# Patient Record
Sex: Female | Born: 1935 | Race: White | Hispanic: No | Marital: Married | Smoking: Never smoker
Health system: Southern US, Community
[De-identification: ages and names within clinical notes are randomized; demographics above are authoritative.]

## PROBLEM LIST (undated history)

## (undated) DIAGNOSIS — M1990 Unspecified osteoarthritis, unspecified site: Secondary | ICD-10-CM

## (undated) DIAGNOSIS — R011 Cardiac murmur, unspecified: Secondary | ICD-10-CM

## (undated) DIAGNOSIS — C801 Malignant (primary) neoplasm, unspecified: Secondary | ICD-10-CM

## (undated) DIAGNOSIS — M25561 Pain in right knee: Secondary | ICD-10-CM

## (undated) DIAGNOSIS — C549 Malignant neoplasm of corpus uteri, unspecified: Secondary | ICD-10-CM

## (undated) DIAGNOSIS — K5090 Crohn's disease, unspecified, without complications: Secondary | ICD-10-CM

## (undated) DIAGNOSIS — C7951 Secondary malignant neoplasm of bone: Secondary | ICD-10-CM

## (undated) DIAGNOSIS — J189 Pneumonia, unspecified organism: Secondary | ICD-10-CM

## (undated) DIAGNOSIS — I10 Essential (primary) hypertension: Secondary | ICD-10-CM

## (undated) HISTORY — DX: Crohn's disease, unspecified, without complications: K50.90

## (undated) HISTORY — DX: Essential (primary) hypertension: I10

## (undated) HISTORY — PX: ABDOMINAL HYSTERECTOMY: SHX81

## (undated) HISTORY — PX: APPENDECTOMY: SHX54

## (undated) HISTORY — PX: NASAL SINUS SURGERY: SHX719

## (undated) HISTORY — PX: COLOSTOMY: SHX63

## (undated) HISTORY — PX: TUBAL LIGATION: SHX77

---
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
## 2000-11-29 HISTORY — PX: SUBTOTAL COLECTOMY: SHX855

## 2000-11-29 HISTORY — PX: ILEOSTOMY: SHX1783

## 2001-01-23 ENCOUNTER — Encounter: Admission: RE | Admit: 2001-01-23 | Discharge: 2001-01-23 | Payer: Self-pay | Admitting: General Surgery

## 2001-02-15 ENCOUNTER — Encounter: Admission: RE | Admit: 2001-02-15 | Discharge: 2001-02-15 | Payer: Self-pay | Admitting: Emergency Medicine

## 2001-02-23 ENCOUNTER — Encounter: Admission: RE | Admit: 2001-02-23 | Discharge: 2001-02-23 | Payer: Self-pay | Admitting: Family Medicine

## 2001-02-28 ENCOUNTER — Ambulatory Visit (HOSPITAL_COMMUNITY): Admission: EM | Admit: 2001-02-28 | Discharge: 2001-02-28 | Payer: Self-pay | Admitting: Family Medicine

## 2002-04-02 ENCOUNTER — Encounter: Payer: Self-pay | Admitting: Family Medicine

## 2002-04-02 ENCOUNTER — Ambulatory Visit (HOSPITAL_COMMUNITY): Admission: RE | Admit: 2002-04-02 | Discharge: 2002-04-02 | Payer: Self-pay | Admitting: Family Medicine

## 2002-04-11 ENCOUNTER — Encounter: Payer: Self-pay | Admitting: Family Medicine

## 2002-04-11 ENCOUNTER — Ambulatory Visit (HOSPITAL_COMMUNITY): Admission: RE | Admit: 2002-04-11 | Discharge: 2002-04-11 | Payer: Self-pay | Admitting: Family Medicine

## 2005-07-28 ENCOUNTER — Ambulatory Visit: Payer: Self-pay | Admitting: Internal Medicine

## 2005-08-05 ENCOUNTER — Ambulatory Visit: Payer: Self-pay | Admitting: Internal Medicine

## 2005-08-05 ENCOUNTER — Ambulatory Visit (HOSPITAL_COMMUNITY): Admission: RE | Admit: 2005-08-05 | Discharge: 2005-08-05 | Payer: Self-pay | Admitting: Internal Medicine

## 2005-08-05 ENCOUNTER — Encounter (INDEPENDENT_AMBULATORY_CARE_PROVIDER_SITE_OTHER): Payer: Self-pay | Admitting: Internal Medicine

## 2006-03-15 ENCOUNTER — Ambulatory Visit (HOSPITAL_COMMUNITY): Admission: RE | Admit: 2006-03-15 | Discharge: 2006-03-15 | Payer: Self-pay | Admitting: Internal Medicine

## 2006-04-06 ENCOUNTER — Ambulatory Visit (HOSPITAL_COMMUNITY): Admission: RE | Admit: 2006-04-06 | Discharge: 2006-04-06 | Payer: Self-pay | Admitting: Internal Medicine

## 2006-09-07 ENCOUNTER — Ambulatory Visit: Payer: Self-pay | Admitting: Internal Medicine

## 2006-11-21 ENCOUNTER — Emergency Department (HOSPITAL_COMMUNITY): Admission: EM | Admit: 2006-11-21 | Discharge: 2006-11-22 | Payer: Self-pay | Admitting: Emergency Medicine

## 2007-03-21 ENCOUNTER — Emergency Department (HOSPITAL_COMMUNITY): Admission: EM | Admit: 2007-03-21 | Discharge: 2007-03-21 | Payer: Self-pay | Admitting: Emergency Medicine

## 2007-03-22 ENCOUNTER — Inpatient Hospital Stay (HOSPITAL_COMMUNITY): Admission: AD | Admit: 2007-03-22 | Discharge: 2007-03-24 | Payer: Self-pay | Admitting: Internal Medicine

## 2007-03-22 ENCOUNTER — Ambulatory Visit: Payer: Self-pay | Admitting: Urgent Care

## 2007-11-30 HISTORY — PX: CATARACT EXTRACTION: SUR2

## 2009-03-12 ENCOUNTER — Ambulatory Visit (HOSPITAL_COMMUNITY): Admission: RE | Admit: 2009-03-12 | Discharge: 2009-03-12 | Payer: Self-pay | Admitting: Internal Medicine

## 2009-03-25 ENCOUNTER — Other Ambulatory Visit: Admission: RE | Admit: 2009-03-25 | Discharge: 2009-03-25 | Payer: Self-pay | Admitting: Obstetrics and Gynecology

## 2009-11-29 HISTORY — PX: COLONOSCOPY: SHX174

## 2010-03-31 ENCOUNTER — Ambulatory Visit (HOSPITAL_COMMUNITY): Admission: RE | Admit: 2010-03-31 | Discharge: 2010-03-31 | Payer: Self-pay | Admitting: Internal Medicine

## 2011-04-16 NOTE — Group Therapy Note (Signed)
Nicole Cochran, Nicole Cochran                    ACCOUNT NO.:  [id number]   MEDICAL RECORD NO.:  [id number]           PATIENT TYPE:  INP   LOCATION:  A310                          FACILITY:  [facility]   PHYSICIAN:  Angus G. Everette Rank, MD   DATE OF BIRTH:  05-Jul-1935   DATE OF PROCEDURE:  DATE OF DISCHARGE:                                 PROGRESS NOTE   This patient has a history of Crohn's disease, a history of subtotal  colectomy with ileostomy.  Was admitted with nausea, vomiting and  diarrhea and felt to have gastroenteritis secondary to food poisoning.  She remains on IV fluids, has had no active vomiting or diarrhea.  Stool  studies are in progress.   OBJECTIVE:  VITAL SIGNS:  Blood pressure 99/42, respirations 20, pulse  68, temp 97.   Chemistries:  Sodium 133, potassium 3.7, chloride 110, CO2 20.   LUNGS:  Clear.  HEART:  Regular rhythm.  ABDOMEN:  Patient has an ileostomy in the right lower quadrant.  Active  bowel sounds.   ASSESSMENT:  Patient was admitted with gastroenteritis.  She does have a  history of Crohn's disease.   She remains on Cipro and Flagyl.  Plan to continue current regimen.      Angus G. Everette Rank, MD  Electronically Signed     AGM/MEDQ  D:  03/23/2007  T:  03/23/2007  Job:  [id number]

## 2011-04-16 NOTE — Consult Note (Signed)
Nicole Cochran, TESCHNER                    ACCOUNT NO.:  [id number]   MEDICAL RECORD NO.:  [id number]          PATIENT TYPE:  INP   LOCATION:  A310                          FACILITY:  [facility]   PHYSICIAN:  R. Garfield Cornea, M.D. DATE OF BIRTH:  [birth year]   DATE OF CONSULTATION:  03/22/2007  DATE OF DISCHARGE:                                 CONSULTATION   REASON FOR CONSULTATION:  Gastroenteritis.   HISTORY OF PRESENT ILLNESS:  The patient is a 76 year old Caucasian  female with history of Crohn's disease, status post subtotal colectomy  with ileostomy in 2002, who was admitted with a 3 day history of nausea,  vomiting, and diarrhea. She states the symptoms occurred approximately 6  hours after she ate hamburger with onions. Her husband and son ate the  same thing. The meal was cooked at home and the meat was thoroughly  cooked. All 3 became abruptly ill about 6 hours after the meal. She  states that initially it was vomiting, followed by diarrhea. She was  seen in the emergency department yesterday and was treated and released.  At that time, her creatinine was 2.29, BUN 36. She saw Dr. Nevada Crane today  and was a direct admit to the hospital for dehydration and  gastroenteritis. The patient states that she is having to empty her  ileostomy bag every 30 minutes. Before she became ill, she would empty  about every 2 to 3 hours. She denies seeing any blood in her bag. She  has had some rectal bleeding. She has a history of chronic rectal  discharge for the last 2 days with some bloody discharge. See below for  details of her last flexible sigmoidoscopy. 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 She denies any hematemesis, heartburn, and abdominal  pain.   HOME MEDICATIONS:  1. Diovan HCT 320/25 mg daily.  2. Amlodipine 10 mg daily.   ALLERGIES:  PENICILLIN AN ADHESIVE TAPE.   PAST MEDICAL HISTORY:  Hypertension, Crohn's disease, status post  subtotal colectomy with end to end ileostomy in  2002 for multiple  colonic perforations. She had colonoscopy in September 2006 by Dr.  Laural Golden for rectal discharge. She was found to have anal stenosis,  proctitis and sigmoiditis. 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 She was treated with __________  suppositories with some improvement and has not been seen in over a  year. She had sinus surgery twice, appendectomy, tubal ligation.   FAMILY HISTORY:  She has a niece who has Crohn's disease. A sister was  diagnosed with colon cancer at age 32.   SOCIAL HISTORY:  She is married with 5 children. 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 She is a non-smoker, no alcohol  use.   REVIEW OF SYSTEMS:  GASTROINTESTINAL:  See HPI. CONSTITUTIONAL:  Denies  weight loss other than the last couple of days. She feels weak.  CARDIOPULMONARY:  No chest pain or shortness of breath. GENITOURINARY:  She states that she has not urinated since yesterday.   PHYSICAL EXAMINATION:  VITAL SIGNS:  Temperature 96.8, pulse 86,  respiratory rate 20, blood pressure 113/72. Weight 150.9 pounds.  GENERAL:  A pleasant elderly Caucasian female in no acute distress.  SKIN:  Warm and dry. No jaundice.  HEENT:  Sclerae nonicteric. Oropharyngeal mucosa moist and pink.  CHEST:  Lungs clear to auscultation.  CARDIOVASCULAR:  Regular rate and rhythm. No murmur, rub, or gallop.  ABDOMEN:  Positive bowel sounds. Soft and nondistended. Multiple scars  from previous incisions. She has an ileostomy in the right lower  quadrant that has greenish brown liquid stool and air present. Non-  tender. No organomegaly or masses.  EXTREMITIES:  No edema.   LABORATORY DATA:  White count 8,200. Hemoglobin 13.9. Platelets 362,000.  Sodium 134, potassium 3.9, BUN 46, creatinine 2.13. Glucose 122, total  bilirubin 0.6. Alkaline phosphatase 64. AST 30, ALT 27, albumin 4.2.   IMPRESSION:  The patient is a 76 year old  Caucasian female with history  of Crohn's disease, status post subtotal colectomy with ileostomy, who  now presents with acute onset nausea, vomiting, and diarrhea. She most  likely has gastroenteritis secondary to food poisoning. She has symptoms  that occurred abruptly after a meal with 2 other family members, who  became ill as well. Chronically, she has had no symptoms suggestive of  active Crohn's disease.   RECOMMENDATIONS:  1. Agree with stool studies. Will also include culture, O&P, WBC, and      E. coli 0157:H7.  2. Supportive measures.  3. Will address with Dr. Gala Romney within the setting of known history of      Crohn's disease. Will consider empiric trial of antibiotics with      Ciprofloxacin and Flagyl.  4. Further recommendations to follow.      Neil Crouch, P.ABridgette Habermann, M.D.  Electronically Signed    LL/MEDQ  D:  03/22/2007  T:  03/22/2007  Job:  [id number]   cc:   Delphina Cahill, M.D.  Fax: [phone]

## 2011-04-16 NOTE — Op Note (Signed)
Nicole Cochran, Nicole Cochran                    ACCOUNT NO.:  [id number]   MEDICAL RECORD NO.:  [id number]          PATIENT TYPE:  AMB   LOCATION:  DAY                           FACILITY:  [facility]   PHYSICIAN:  Hildred Laser, M.D.    DATE OF BIRTH:  1935/08/15   DATE OF PROCEDURE:  08/05/2005  DATE OF DISCHARGE:                                 OPERATIVE REPORT   PROCEDURE:  Flexible sigmoidoscopy.   INDICATION:  Nicole Cochran is 76 year old Caucasian female with intermittent rectal  discharge which at times is blood-tinged. She has history of Crohn's  disease. She has subtotal colectomy with end ileostomy in April 2002 for  colonic perforation secondary to Crohn's disease. It is suspected that she  may have diversion colitis, but we need to make sure that she does not have  neoplasm in the rectum or sigmoid stump. Procedure risks were reviewed the  patient, informed consent was obtained.   PREMEDICATION:  None.   FINDINGS:  Procedure performed in endoscopy suite. The patient was placed  left lateral position. Rectal examination performed. She had no external  abnormality, but on digital exam she had anal stenosis. There was no mass.  Stenosis felt to be noncritical. Olympus videoscope was placed rectum where  mucosa was noted to be friable with multiple erosions. There was diffuse  involvement. Scope was passed into the distal sigmoid colon leading to the  blunt-end and this segment of mucosa was also involved diffusely. Pictures  taken followed by biopsies from the rectum. Was unable to retroflex the  scope in the rectum. Endoscope was withdrawn.   Acute proctitis and sigmoiditis of distal sigmoid rim. These changes are  felt to be very suspicious for diversion colitis.   RECOMMENDATIONS:  Conasa suppositories 1 gram per rectum at bedtime daily  for 30 days.   I will be contacting the patient with biopsy results.  If this therapy does  not work next step would be short chain fatty acid  enemas.      Hildred Laser, M.D.  Electronically Signed     NR/MEDQ  D:  08/05/2005  T:  08/05/2005  Job:  [id number]   cc:   Angus G. Everette Rank, [place]  [place] [address]  Fax: [phone]

## 2011-04-16 NOTE — Discharge Summary (Signed)
Nicole Cochran, Nicole Cochran                    ACCOUNT NO.:  [id number]   MEDICAL RECORD NO.:  [id number]          PATIENT TYPE:  INP   LOCATION:  A310                          FACILITY:  [facility]   PHYSICIAN:  Delphina Cahill, M.D.        DATE OF BIRTH:  1935/08/02   DATE OF ADMISSION:  03/22/2007  DATE OF DISCHARGE:  04/25/2008LH                               DISCHARGE SUMMARY   DISCHARGE DIAGNOSES:  1. Severe diarrhea.  2. Dehydration.  3. Gastroenteritis.  4. History of Crohn disease?  5. Prerenal azotemia.   DISCHARGE MEDICATIONS:  1. Cipro 500 mg b.i.d. for eight more days.  2. Flagyl 500 mg t.i.d. for eight more days.  3. Flora-Q take 1 tablet p.o. daily.  4. The patient is to resume Diovan and Norvasc beginning next Monday.   DISCHARGE PHYSICAL EXAMINATION:  VITAL SIGNS:  Temperature is 97.9,  blood pressure 130/68, pulse 65, respirations 20.  GENERAL:  This is much more healthy-appearing, elderly, white female at  this time.  No acute distress.  SKIN:  Warm and dry.  HEENT:  Unremarkable.  CHEST: Clear to auscultation bilaterally.  HEART:  Regular rate and rhythm.  No murmurs, gallops or rubs.  ABDOMEN: Soft, nontender, positive bowel sounds.  Ileostomy is in the  right abdomen.  It shows normal type stool.  EXTREMITIES:  No lower extremity edema.  NEUROLOGIC:  Cranial nerves II-XII intact.  No deficits.   RESULTS OBTAINED DURING HOSPITALIZATION:  Initial CBC showed white count  8.2, hemoglobin 13.9.  The day before discharge, CBC shows white count  of 5.9, hemoglobin 11.5, platelet count 283.  Initial B-MET on admission  showed a BUN of 46, creatinine of 2.13.  Normal liver enzymes.  UA  showed large leukocytes, large blood, but otherwise normal.  C diff  toxin was negative.  Ova and parasites was negative.  Fecal lactoferrin  is negative.  Stool culture showed no suspicious colonies.  E-coli toxin  still pending.   HOSPITAL COURSE:  The patient had a question of gastroenteritis  versus  some form of food-borne illness, was seen and assessed by Dr. Gala Romney, and  given the history of Crohn disease, although I am not sure exactly of  this diagnosis as the patient denies that she has Crohn disease,  although it was mentioned throughout her previous chart.  She was  started on empiric antibiotics, both Cipro and Flagyl I.V. and tolerated  these without problems.  She received significant amount of fluid and  had decreased amount of diarrhea.   Prerenal azotemia.  Initially when she came in and had an elevated BUN  and creatinine.  At the time of the day before discharge, her BUN had  returned back to 20 with creatinine a 1.06, still up a little bit from  her baseline.  We will recheck a B-MET in 2 weeks to assure that has  returned back normal.  She is to hold her blood pressure medicines for  next several days, until she is able to eat and drink at her fullest.  Orthostatic hypotension on exam.  Initially, she did have orthostasis  and did that have weakness more with standing.  This has resolved with  IV fluids and is doing much better now.   DISPOSITION:  The patient will be discharged to home and follow up in  the office in 2 weeks for further assessment.  She is call back if she has any further problems before then.  She is return to work on March 27, 2007, unless she has any other  concerns.      Delphina Cahill, M.D.  Electronically Signed     ZH/MEDQ  D:  03/24/2007  T:  03/24/2007  Job:  [id number]

## 2011-04-16 NOTE — H&P (Signed)
NAMESINCLAIRE, ARTIGA                    ACCOUNT NO.:  [id number]   MEDICAL RECORD NO.:  [id number]           PATIENT TYPE:  INP   LOCATION:  A310                          FACILITY:  [facility]   PHYSICIAN:  Delphina Cahill, M.D.        DATE OF BIRTH:  [birth year]   DATE OF ADMISSION:  DATE OF DISCHARGE:  LH                              HISTORY & PHYSICAL   CHIEF COMPLAINT:  Severe weakness and continued diarrhea.   HISTORY OF PRESENT ILLNESS:  Ms. Stallone is an 76 year old white female who  has extensive abdominal history, status post a subtotal colectomy with  end ileostomy in April 2002 with what was believed to be multiple  colonic perforations secondary to Crohn disease, but unclear whether  this was true; and whether it was related more to diversional colitis  which is what is to be more likely the case.  She has seen Dr. Laural Golden  before for this.  She was in her usual state of health up until 4 days  ago, when she states she ate a hamburger with onion and began having  nausea and one episode of vomiting which then turned into profuse  diarrhea, and the diarrhea continued until she went to the emergency  department yesterday.  At that time lab work was obtained revealing  dehydration and probably some prerenal azotemia with elevated creatinine  and BUN.  She was given significant amount of fluids and given something  for the diarrhea.  She did take two doses of Imodium last evening, but  continues to have worsening diarrhea.  The worst complaint is that she  feels significantly weak.  She did not have any problems with abdominal  pain, at this time; and has not had any specific abdominal pain.  She  does not notice any blood in her colostomy, and no black tarry stools  noted.   On exam, today, she does have orthostatic-type hypotension and continues  to be severely weak.   PAST MEDICAL HISTORY:  Significant for chronic diarrhea previously;  question history of Crohn disease versus diversional  colitis,  hypertension, status post tubal ligation, appendectomy. She has had  sinus surgery before; has had D&C before, and anxiety before.   FAMILY HISTORY:  The patient denies any specific cancers in her family.  Her father died of MI and pneumonia.  Mother died at age 49, unknown  cause.  She has seven sisters; two died of kidney failure, and two  brothers one who died of MI and diabetes, some type of cancer unknown.   SOCIAL HISTORY:  She has four children.  She retired from work and has  currently been working as a Tourist information centre manager at [employer].  She denies any smoking  or alcohol use.   REVIEW OF SYSTEMS:  Denies any chest pain.  No shortness of breath.  No  fever or chills.  Some abdominal discomfort with the diarrhea, but no  significant pain.  No problems with urination.   PHYSICAL EXAM:  VITAL SIGNS:  Today, reveals a blood pressure initially  sitting of  110/68 with a pulse of 82; when patient stands blood pressure  is 95/62 with a pulse in the 95 range.  GENERAL:  This a pleasant well-developed, pale-appearing white female in  no acute distress.  SKIN:  Warm and dry.  No signs of jaundice.  HEENT: Pupils equal, round, and react to light and accommodation.  Oropharynx is clear, mildly dry mucosa.  CHEST:  Clear to auscultation bilaterally.  HEART:  Regular rate and rhythm.  No murmurs, gallops, or rubs.  ABDOMEN:  Soft, nontender, positive bowel sounds.  No hepatosplenomegaly  appreciated.  No guarding.  Ileostomy in right abdomen with a tan stool  present.  EXTREMITIES:  No lower extremity edema.  NEUROLOGIC:  Cranial nerves II-XII intact.  No deficits.   IMPRESSION:  Ms. Gunnels is a 76 year old with what appears to be a  gastroenteritis, but also found to have some dehydration and some likely  prerenal azotemia.   ASSESSMENT AND PLAN:  1. Gastroenteritis.  She has no more signs of nausea and vomiting, but      continues to have diarrhea.  Will give one dose of Lomotil and do       that b.i.d. as needed.  We will get gastroenterology to see the      patient, given her significant history before.  Unclear whether      this is related to any of her colitis or question of Crohn's      disease before.  2. Prerenal azotemia acute renal failure.  On check yesterday in the      emergency department did have a creatinine of 2.29 and a BUN of 39      which is up from her normal baseline, which was last recorded to be      a creatinine of 0.73 with a BUN of 8.  Will rehydrate with the same      amount of fluids and check a spot urine, creatinine and sodium; and      see if it truly is prerenal azotemia.  3. Orthostatic hypotension secondary to dehydration; and she continues      to have a generalized weakness.  We will replete with fluids and      recheck in the morning.  4. Hypertension.  The patient has been off of her blood pressure      medicines since Sunday, when this episode started.  Will hold off      on all of blood pressure medicines for now, given her orthostatic      hypotension, as well as her acute renal failure.  If this is not      resolved; then we will get a kidney specialist to see the patient.   DISPOSITION:  The patient admitted to the hospital and routine lab work  will be checked.      Delphina Cahill, M.D.  Electronically Signed     ZH/MEDQ  D:  03/22/2007  T:  03/22/2007  Job:  [id number]

## 2011-04-16 NOTE — H&P (Signed)
Nicole Cochran, Nicole Cochran                    ACCOUNT NO.:  [id number]   MEDICAL RECORD NO.:  [id number]           PATIENT TYPE:  AMB   LOCATION:                                FACILITY:  [facility]   PHYSICIAN:  Hildred Laser, M.D.    DATE OF BIRTH:  August 06, 1935   DATE OF ADMISSION:  DATE OF DISCHARGE:  LH                                HISTORY & PHYSICAL   CHIEF COMPLAINT:  Yearly followup, Crohn's disease.   HISTORY OF PRESENT ILLNESS:  Nicole Cochran is a 76 year old Caucasian female who  is status post subtotal colectomy with end-ileostomy in April of 2002 for  multiple colonic perforations secondary to Crohn's disease. Her surgery was  done at [facility]. She was last seen on  July 06, 2004. She was having episodes of rectal discharge and felt to  possibly have diversion colitis. She was being treated with topical  mesalamine suppositories. She had good response to treatment. She was  advised if she had recurrent rectal discharge that proctoscopy would be  recommended because she is still at risk for rectal or sigmoid colon  neoplasm. Overall, she feels well. She has good energy level. No nausea,  vomiting, anorexia. Her ileostomy output has been normal. Denies any melena  or rectal bleeding. She has not been on her blood pressure medications  lately. She states she checks her blood pressure three times a week at [facility]. It typically runs in the 140s/80s. She has had some issues with  anxiety and is taking Ativan on a p.r.n. basis.   CURRENT MEDICATIONS:  1.  Multivitamin daily.  2.  Ativan 0.5 mg 1/4 of a tablet p.r.n.   ALLERGIES:  PENICILLIN.   PAST MEDICAL HISTORY:  1.  History of chronic diarrhea which she treated for years with Kaopectate      and Imodium. She had a flexible sigmoidoscopy by Dr. Laural Golden in 1999      which was normal to 55 cm. She has had rectal fissure with surgery at      [facility] on February 10, 2005. Ultimately ended up with subtotal  colectomy with      end-ileostomy for multiple colonic perforations on April 2002 at [facility].  2.  Crohn's disease as above.  3.  Hypertension.  4.  Status post tubal ligation and appendectomy.  5.  Heart catheterization 1994.  6.  Sinus surgery twice.  7.  D&C.   FAMILY HISTORY:  Negative for colorectal cancer. She has a niece with  Crohn's disease.   SOCIAL HISTORY:  She is married and has five children. 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 Denies any smoking or alcohol use.   REVIEW OF SYSTEMS:  CARDIOPULMONARY:  Denies chest pain, shortness of  breath, or cough. CONSTITUTIONAL:  Denies weight loss, fatigue, anorexia.   PHYSICAL EXAMINATION:  VITAL SIGNS:  Weight  154, height 5 foot 2,  temperature 98, blood pressure 172/80, pulse 72.  GENERAL:  Pleasant, well-developed, well-nourished, Caucasian female in no  acute distress.  SKIN:  Warm and dry. No jaundice.  HEENT:  Conjunctivae are pink. Sclerae are nonicteric. Oropharyngeal mucosa  moist and pink. No lesions, erythema, or exudate. No lymphadenopathy or  thyromegaly.  CHEST:  Lungs are clear to auscultation.  CARDIAC:  Reveals regular rate and rhythm. Normal S1 and S2. No murmurs,  rubs, or gallops.  ABDOMEN:  Positive bowel sounds, soft, nontender, nondistended. No  organomegaly or masses. No rebound tenderness or guarding. Ileostomy in the  right mid abdomen with pasty green stool present.  EXTREMITIES:  No edema.  RECTAL:  Deferred until the time of proctoscopy.   IMPRESSION:  Nicole Cochran is a 76 year old lady with subtotal colectomy and end-  ileostomy in April of 2002 for colonic perforation related to Crohn's  disease. She has had rectal discharge off and on for over a year now.  Previously, this responded well to mesalamine topical therapy. She is still  at risk for rectal or  sigmoid colon neoplasm. I suspect we are dealing with  Crohn's colitis.   PLAN:  1.  Proctoscopy in the near future.  2.  She asked me to refill her Kenalog aerosol spray 63 g container apply as      directed with 1 refill. She uses this when she breaks down around her      ileostomy site.      Neil Crouch, P.A.      Hildred Laser, M.D.  Electronically Signed    LL/MEDQ  D:  07/28/2005  T:  07/28/2005  Job:  [id number]   cc:   Angus G. Everette Rank, [place]  [place] [address]  Fax: [phone]

## 2011-06-29 ENCOUNTER — Other Ambulatory Visit (HOSPITAL_COMMUNITY): Payer: Self-pay | Admitting: Internal Medicine

## 2011-06-29 DIAGNOSIS — Z139 Encounter for screening, unspecified: Secondary | ICD-10-CM

## 2011-07-06 ENCOUNTER — Ambulatory Visit (HOSPITAL_COMMUNITY)
Admission: RE | Admit: 2011-07-06 | Discharge: 2011-07-06 | Disposition: A | Discharge status: Home / Self Care | Payer: Medicare Other | Source: Ambulatory Visit | Attending: Internal Medicine | Admitting: Internal Medicine

## 2011-07-06 DIAGNOSIS — Z1231 Encounter for screening mammogram for malignant neoplasm of breast: Secondary | ICD-10-CM | POA: Insufficient documentation

## 2011-07-06 DIAGNOSIS — Z139 Encounter for screening, unspecified: Secondary | ICD-10-CM

## 2011-11-02 ENCOUNTER — Ambulatory Visit (INDEPENDENT_AMBULATORY_CARE_PROVIDER_SITE_OTHER): Payer: Medicare Other | Admitting: Internal Medicine

## 2011-11-02 ENCOUNTER — Encounter (INDEPENDENT_AMBULATORY_CARE_PROVIDER_SITE_OTHER): Payer: Self-pay | Admitting: Internal Medicine

## 2011-11-02 DIAGNOSIS — I10 Essential (primary) hypertension: Secondary | ICD-10-CM | POA: Insufficient documentation

## 2011-11-02 DIAGNOSIS — E7800 Pure hypercholesterolemia, unspecified: Secondary | ICD-10-CM

## 2011-11-02 DIAGNOSIS — M129 Arthropathy, unspecified: Secondary | ICD-10-CM

## 2011-11-02 DIAGNOSIS — K5090 Crohn's disease, unspecified, without complications: Secondary | ICD-10-CM

## 2011-11-02 DIAGNOSIS — M1990 Unspecified osteoarthritis, unspecified site: Secondary | ICD-10-CM | POA: Insufficient documentation

## 2011-11-02 NOTE — Patient Instructions (Signed)
Continue present medications. OV in 1 year.

## 2011-11-02 NOTE — Progress Notes (Signed)
Subjective:     Patient ID: Nicole Cochran, female   DOB: 1935-01-27, 76 y.o.   MRN: [id number]  HPI Nicole Cochran is a 76 yr old female here for a scheduled visit.  She has a hx of Crohn's disease status post subtotal colectomy with ileostomy in 2002.  Marland Kitchen  She had a flexible sigmoidoscopy back in September 2006 because of intermittent rectal rectal discharge that at times was blood tinged.  She had acute proctitis and sigmoiditis on the distal sigmoid rim. 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 Biopsy revealed lymphoplasmacytic colitis. She was treated with Canasa suppositories for one month.  In December of 2007 she suddenly developed nausea, vomiting and copious diarrhea.  She had a similar episode in April of 2008   She tells me she is doing good . Appetite is good. No weight loss. She is emptying her colostomy bag about every 2 1/2 hrs while at work. No melena or rectal bleeding.   No abdominal pain. She continues to work at [employer]. Last colonoscopy 06/2011: Markedly inflamed colonic tissue showing patchy acute inflammatory infiltrates. Lymphoid follicular hyperplasia, and scattered plasmacytic.  Review of Systems see hpi Current Outpatient Prescriptions  Medication Sig Dispense Refill  . amLODipine-valsartan (EXFORGE) 10-320 MG per tablet Take 1 tablet by mouth daily.        . diclofenac (VOLTAREN) 75 MG EC tablet Take 75 mg by mouth 2 (two) times daily.        . Multiple Vitamin (MULTIVITAMIN) tablet Take 1 tablet by mouth daily.        . Omega-3 Fatty Acids (FISH OIL) 1000 MG CAPS Take 5 capsules by mouth.         Past Surgical History  Procedure Date  . Appendectomy   . Tubal ligation   . Nasal sinus surgery    Past Medical History  Diagnosis Date  . Crohn's    History   Social History  . Marital Status: Married    Spouse Name: N/A    Number of Children: N/A  . Years of Education: N/A   Occupational History  . Not on file.   Social History Main Topics  . Smoking  status: Never Smoker   . Smokeless tobacco: Not on file  . Alcohol Use: No  . Drug Use: No  . Sexually Active: Not on file   Other Topics Concern  . Not on file   Social History Narrative  . No narrative on file   Family Status  Relation Status Death Age  . Mother Deceased     old age  . Father Deceased     MI  . Sister Alive     good health. One deceased diabetes  . Brother Deceased     One MI, One diabetes, lung cancer   Not on File     Objective:   Physical Exam Filed Vitals:   11/02/11 1116  BP: 170/80  Pulse: 72  Temp: 98 F (36.7 C)  Height: 5' 2"  (1.575 m)  Weight: 159 lb (72.122 kg)    Alert and oriented. Skin warm and dry. Oral mucosa is moist. Dentures.. Sclera anicteric, conjunctivae is pink. Thyroid not enlarged. No cervical lymphadenopathy. Lungs clear. Heart regular rate and rhythm.  Abdomen is soft. Bowel sounds are positive. No hepatomegaly. No abdominal masses felt. No tenderness.Ileostomy bag in place  No edema to lower extremities. Patient is alert and oriented.      Assessment:   Crohn's which is  in remission at this time.  She is doing well.     Plan:    OV in one year.

## 2012-01-26 ENCOUNTER — Emergency Department (HOSPITAL_COMMUNITY)
Admission: EM | Admit: 2012-01-26 | Discharge: 2012-01-27 | Disposition: A | Discharge status: Home / Self Care | Payer: Medicare Other | Attending: Emergency Medicine | Admitting: Emergency Medicine

## 2012-01-26 ENCOUNTER — Encounter (HOSPITAL_COMMUNITY): Payer: Self-pay | Admitting: Emergency Medicine

## 2012-01-26 DIAGNOSIS — Z933 Colostomy status: Secondary | ICD-10-CM | POA: Insufficient documentation

## 2012-01-26 DIAGNOSIS — R197 Diarrhea, unspecified: Secondary | ICD-10-CM | POA: Insufficient documentation

## 2012-01-26 DIAGNOSIS — R112 Nausea with vomiting, unspecified: Secondary | ICD-10-CM | POA: Insufficient documentation

## 2012-01-26 DIAGNOSIS — I10 Essential (primary) hypertension: Secondary | ICD-10-CM | POA: Insufficient documentation

## 2012-01-26 DIAGNOSIS — K5090 Crohn's disease, unspecified, without complications: Secondary | ICD-10-CM | POA: Insufficient documentation

## 2012-01-26 NOTE — ED Notes (Signed)
Pt from home with c/o nausea and vomiting that started tonight. Pt has a prescription for Phenergan however has not been able to keep anything down. Pt denies any pain, reports stomach cramping prior to nausea.

## 2012-01-27 ENCOUNTER — Other Ambulatory Visit: Payer: Self-pay

## 2012-01-27 LAB — CBC
HCT: 38.9 % (ref 36.0–46.0)
Hemoglobin: 13.5 g/dL (ref 12.0–15.0)
MCH: 30.7 pg (ref 26.0–34.0)
MCHC: 34.7 g/dL (ref 30.0–36.0)
RDW: 13.4 % (ref 11.5–15.5)

## 2012-01-27 LAB — BASIC METABOLIC PANEL
BUN: 26 mg/dL — ABNORMAL HIGH (ref 6–23)
Calcium: 8.3 mg/dL — ABNORMAL LOW (ref 8.4–10.5)
GFR calc Af Amer: >90 mL/min (ref >90)
GFR calc non Af Amer: 82 mL/min — ABNORMAL LOW (ref >90)
Glucose, Bld: 121 mg/dL — ABNORMAL HIGH (ref 70–99)
Potassium: 3.7 mEq/L (ref 3.5–5.1)
Sodium: 138 mEq/L (ref 135–145)

## 2012-01-27 MED ORDER — SODIUM CHLORIDE 0.9 % IV BOLUS (SEPSIS)
1000.0000 mL | Freq: Once | INTRAVENOUS | Status: AC
  Administered 2012-01-27: 1000 mL via INTRAVENOUS

## 2012-01-27 MED ORDER — ONDANSETRON HCL 4 MG/2ML IJ SOLN
4.0000 mg | Freq: Once | INTRAMUSCULAR | Status: AC
  Administered 2012-01-27: 4 mg via INTRAVENOUS
  Filled 2012-01-27: qty 2

## 2012-01-27 MED ORDER — ONDANSETRON 8 MG PO TBDP: 8.0000 mg | ORAL_TABLET | Freq: Three times a day (TID) | ORAL | Status: AC | PRN

## 2012-01-27 NOTE — ED Notes (Signed)
Patient moved from hallway bed to room #12. No acute distress noted at this time.

## 2012-01-27 NOTE — ED Notes (Addendum)
States nausea is a little better. Denies any needs. Call bell within reach. Will continue to monitor.

## 2012-01-27 NOTE — ED Notes (Signed)
Lab at bedside to draw blood.

## 2012-01-27 NOTE — ED Notes (Signed)
Medicated as ordered for nausea. Denies any needs at this time. Family with patient. Langley Gauss being able to give urine specimen at this time. No distress. No episodes of vomiting.

## 2012-01-27 NOTE — ED Notes (Signed)
Patient states after eating dinner at 1800 tonight she started feeling bad around 1090. Has had four episodes of vomiting since then. States she started having abdominal cramping in upper stomach prior to vomiting. Has an ileostomy in right middle abdomen. States she emptied it prior to arrival. Active bowel sounds in all quads. No abdominal tenderness, soft. In no distress. Awaiting to be seen. Family with patient.

## 2012-01-27 NOTE — ED Notes (Signed)
MD at bedside to evaluate.

## 2012-01-27 NOTE — ED Notes (Signed)
Ambulatory to bathroom to obtain urine specimen via clean catch.

## 2012-01-27 NOTE — ED Notes (Signed)
Patient unable to void for urine specimen at this time. Ambulated back to hallway bed with steady gait.

## 2012-01-27 NOTE — ED Provider Notes (Signed)
History     CSN: [id number]  Arrival date & time 01/26/12  2343   First MD Initiated Contact with Patient 01/27/12 0002      Chief Complaint  Patient presents with  . Nausea  . Emesis    The history is provided by the patient.  patient with nausea and vomiting that started abruptly tonight.  She also reports a significant amount of liquid stool formed in her colostomy bag.  She has a history of Crohn's disease status post colostomy.  She denies abdominal pain at this time.  She denies fevers and chills.  She denies melena and hematochezia.  Her vomitus been nonbloody and nonbilious.  She reports that at times she presented the ER she feels much better at this time.  She had attempted to take Phenergan down home but was unable to keep it down secondary to her severe nausea and vomiting.  She denies chest pain shortness of breath.  She has no fevers or chills.  She feels much better at this time.  Nothing worsens her symptoms.  Nothing improves her symptoms.   Past Medical History  Diagnosis Date  . Crohn's   . Hypertension     Past Surgical History  Procedure Date  . Appendectomy   . Tubal ligation   . Nasal sinus surgery   . Ileostomy     History reviewed. No pertinent family history.  History  Substance Use Topics  . Smoking status: Never Smoker   . Smokeless tobacco: Not on file  . Alcohol Use: No    OB History    Grav Para Term Preterm Abortions TAB SAB Ect Mult Living                  Review of Systems  Gastrointestinal: Positive for vomiting.  All other systems reviewed and are negative.    Allergies  Penicillins and Tape  Home Medications   Current Outpatient Rx  Name Route Sig Dispense Refill  . PROMETHAZINE HCL 25 MG PO TABS Oral Take 25 mg by mouth every 6 (six) hours as needed.    Marland Kitchen AMLODIPINE BESYLATE-VALSARTAN 10-320 MG PO TABS Oral Take 1 tablet by mouth daily.      . CELECOXIB 100 MG PO CAPS Oral Take 100 mg by mouth 2 (two) times daily.      Marland Kitchen DICLOFENAC SODIUM 75 MG PO TBEC Oral Take 75 mg by mouth 2 (two) times daily.      Marland Kitchen ONE-DAILY MULTI VITAMINS PO TABS Oral Take 1 tablet by mouth daily.      Marland Kitchen FISH OIL 1000 MG PO CAPS Oral Take 5 capsules by mouth.      . ONDANSETRON 8 MG PO TBDP Oral Take 1 tablet (8 mg total) by mouth every 8 (eight) hours as needed for nausea. 10 tablet 0    BP 130/58  Pulse 70  Temp(Src) 97.5 F (36.4 C) (Oral)  Resp 16  SpO2 99%  Physical Exam  Nursing note and vitals reviewed. Constitutional: She is oriented to person, place, and time. She appears well-developed and well-nourished. No distress.  HENT:  Head: Normocephalic and atraumatic.  Eyes: EOM are normal.  Neck: Normal range of motion.  Cardiovascular: Normal rate, regular rhythm and normal heart sounds.   Pulmonary/Chest: Effort normal and breath sounds normal.  Abdominal: Soft. She exhibits no distension. There is no tenderness. There is no rebound and no guarding.       air a small amount of liquid stool  that is normal in color in the colostomy bag  Musculoskeletal: Normal range of motion.  Neurological: She is alert and oriented to person, place, and time.  Skin: Skin is warm and dry.  Psychiatric: She has a normal mood and affect. Judgment normal.    ED Course  Procedures (including critical care time)   Date: 01/27/2012  Rate: 73  Rhythm: normal sinus rhythm  QRS Axis: normal  Intervals: normal  ST/T Wave abnormalities: normal  Conduction Disutrbances: none  Narrative Interpretation:   Old EKG Reviewed: no EKG available     Labs Reviewed  CBC - Abnormal; Notable for the following:    WBC 18.8 (*)    All other components within normal limits  BASIC METABOLIC PANEL - Abnormal; Notable for the following:    Glucose, Bld 121 (*)    BUN 26 (*)    Calcium 8.3 (*)    GFR calc non Af Amer 82 (*)    All other components within normal limits  URINALYSIS, WITH MICROSCOPIC   No results found.   1. Nausea vomiting  and diarrhea       MDM  The patient is well-appearing.  Her abdomen is benign.  Her colostomy has liquid stool and gas.  Is no clinical signs of a small bowel obstruction.  This is likely isolated nausea vomiting and diarrhea.  She feels much better at this time.  She is tolerating oral fluids.  DC home in good condition.        Hoy Morn, MD 01/27/12 [phone]

## 2012-06-16 ENCOUNTER — Other Ambulatory Visit (HOSPITAL_COMMUNITY): Payer: Self-pay | Admitting: Internal Medicine

## 2012-06-16 DIAGNOSIS — Z139 Encounter for screening, unspecified: Secondary | ICD-10-CM

## 2012-07-11 ENCOUNTER — Ambulatory Visit (HOSPITAL_COMMUNITY): Payer: Medicare Other

## 2012-07-12 ENCOUNTER — Ambulatory Visit (HOSPITAL_COMMUNITY)
Admission: RE | Admit: 2012-07-12 | Discharge: 2012-07-12 | Disposition: A | Discharge status: Home / Self Care | Payer: Medicare Other | Source: Ambulatory Visit | Attending: Internal Medicine | Admitting: Internal Medicine

## 2012-07-12 DIAGNOSIS — Z139 Encounter for screening, unspecified: Secondary | ICD-10-CM

## 2012-07-12 DIAGNOSIS — Z1231 Encounter for screening mammogram for malignant neoplasm of breast: Secondary | ICD-10-CM | POA: Insufficient documentation

## 2012-10-12 ENCOUNTER — Encounter (HOSPITAL_COMMUNITY): Payer: Self-pay | Admitting: *Deleted

## 2012-10-12 ENCOUNTER — Emergency Department (HOSPITAL_COMMUNITY)
Admission: EM | Admit: 2012-10-12 | Discharge: 2012-10-13 | Disposition: A | Discharge status: Home / Self Care | Payer: Medicare Other | Attending: Emergency Medicine | Admitting: Emergency Medicine

## 2012-10-12 ENCOUNTER — Emergency Department (HOSPITAL_COMMUNITY): Payer: Medicare Other

## 2012-10-12 DIAGNOSIS — Y92009 Unspecified place in unspecified non-institutional (private) residence as the place of occurrence of the external cause: Secondary | ICD-10-CM | POA: Insufficient documentation

## 2012-10-12 DIAGNOSIS — Z791 Long term (current) use of non-steroidal anti-inflammatories (NSAID): Secondary | ICD-10-CM | POA: Insufficient documentation

## 2012-10-12 DIAGNOSIS — I10 Essential (primary) hypertension: Secondary | ICD-10-CM | POA: Insufficient documentation

## 2012-10-12 DIAGNOSIS — W64XXXA Exposure to other animate mechanical forces, initial encounter: Secondary | ICD-10-CM | POA: Insufficient documentation

## 2012-10-12 DIAGNOSIS — S93609A Unspecified sprain of unspecified foot, initial encounter: Secondary | ICD-10-CM

## 2012-10-12 DIAGNOSIS — Y939 Activity, unspecified: Secondary | ICD-10-CM | POA: Insufficient documentation

## 2012-10-12 DIAGNOSIS — K5090 Crohn's disease, unspecified, without complications: Secondary | ICD-10-CM | POA: Insufficient documentation

## 2012-10-12 NOTE — ED Provider Notes (Signed)
History  This chart was scribed for Sharyon Cable, MD by Era Bumpers, ED scribe. This patient was seen in room APA06/APA06 and the patient's care was started at 1915.   CSN: [id number]  Arrival date & time 10/12/12  9563   First MD Initiated Contact with Patient 10/12/12 2307      Chief Complaint  Patient presents with  . Foot Pain   Patient is a 77 y.o. female presenting with foot injury. The history is provided by the patient. No language interpreter was used.  Foot Injury  The incident occurred 1 to 2 hours ago. The incident occurred at home. The injury mechanism was a direct blow. The pain is present in the left foot. The quality of the pain is described as aching. The pain is moderate. The pain has been constant since onset. Associated symptoms include inability to bear weight. Pertinent negatives include no numbness and no tingling. She reports no foreign bodies present. The symptoms are aggravated by bearing weight. Treatments tried: NSAIDs. The treatment provided mild relief.   Nicole Cochran is a 77 y.o. female who presents to the Emergency Department complaining of left foot injury/pain this PM after her dog at home bumped into her foot. She states has previously injured her left foot before and normally has some pain at baseline but today after injuring it, her left foot became more painful and making it difficult for her to walk. She denies any other injuries and has no previous surgeries on her foot. She states normally has swelling to her bilateral lower legs. She had an X-Finch of her left foot in January which showed degenerative changes of her foot. She denies nausea, emesis, or hx of DVT/PE. She denies numbness/tingling of her foot.   Past Medical History  Diagnosis Date  . Crohn's   . Hypertension     Past Surgical History  Procedure Date  . Appendectomy   . Tubal ligation   . Nasal sinus surgery   . Ileostomy     No family history on file.  History  Substance Use  Topics  . Smoking status: Never Smoker   . Smokeless tobacco: Not on file  . Alcohol Use: No    OB History    Grav Para Term Preterm Abortions TAB SAB Ect Mult Living                  Review of Systems  Constitutional: Negative for fever and chills.  Respiratory: Negative for shortness of breath.   Cardiovascular: Negative for chest pain.  Gastrointestinal: Negative for nausea, vomiting and abdominal pain.  Musculoskeletal:       Left foot pain. Baseline foot swelling.   Neurological: Negative for tingling, weakness and numbness.  All other systems reviewed and are negative.    Allergies  Penicillins and Tape  Home Medications   Current Outpatient Rx  Name  Route  Sig  Dispense  Refill  . ACETAMINOPHEN 500 MG PO TABS   Oral   Take 500 mg by mouth daily as needed.         Marland Kitchen AMLODIPINE BESYLATE 10 MG PO TABS   Oral   Take 10 mg by mouth every evening.          Marland Kitchen ONE-DAILY MULTI VITAMINS PO TABS   Oral   Take 1 tablet by mouth every morning.          Marland Kitchen NAPROXEN SODIUM 220 MG PO TABS   Oral   Take  220 mg by mouth daily as needed. For pain         . FISH OIL 1000 MG PO CAPS   Oral   Take 5 capsules by mouth every morning.          Marland Kitchen UNKNOWN TO PATIENT   Oral   Take 1 tablet by mouth daily. THYROID: name is unknown         . VALSARTAN 320 MG PO TABS   Oral   Take 320 mg by mouth daily.           Triage vitals: BP 140/67  Pulse 76  Temp 97.3 F (36.3 C) (Oral)  Resp 20  Ht 5' 2"  (1.575 m)  Wt 155 lb (70.308 kg)  BMI 28.35 kg/m2  SpO2 99%  Physical Exam CONSTITUTIONAL: Well developed/well nourished HEAD AND FACE: Normocephalic/atraumatic EYES: EOMI/PERRL ENMT: Mucous membranes moist NECK: supple no meningeal signs SPINE:entire spine nontender CV: S1/S2 noted, no murmurs/rubs/gallops noted LUNGS: Lungs are clear to auscultation bilaterally, no apparent distress ABDOMEN: soft, nontender, no rebound or guarding NEURO: Pt is  awake/alert, moves all extremitiesx4 EXTREMITIES: pulses normal, full ROM, Tender to anterior surface of her left foot, no deformity noted Symmetric pitting edema to both legs, no erythema, no calf tenderness SKIN: warm, color normal PSYCH: no abnormalities of mood noted ED Course  Procedures  DIAGNOSTIC STUDIES: Oxygen Saturation is 99% on room air, normal by my interpretation.    COORDINATION OF CARE: At 1125 PM Discussed treatment plan with patient which includes left foot X-Asch. Patient agrees.    Labs Reviewed - No data to display Dg Foot Complete Left  10/12/2012  *RADIOLOGY REPORT*  Clinical Data: Dorsal foot pain.  LEFT FOOT - COMPLETE 3+ VIEW  Comparison: None.  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  No other significant abnormalities of the foot.  Slight arthritis of the ankle joint.  IMPRESSION: Arthritic changes as described.  No acute osseous abnormality.   Original Report Authenticated By: Lorriane Shire, M.D.      1. Foot sprain     Pt reports foot pain for months, worse tonight when dog ran into foot.   Advised postop shoe and f/u with her podiatrist.  Doubt occult fracture.  Doubt PE, reports symmetric edema that is unchanged  MDM  Nursing notes including past medical history and social history reviewed and considered in documentation xrays reviewed and considered   I personally performed the services described in this documentation, which was scribed in my presence. The recorded information has been reviewed and is accurate.            Sharyon Cable, MD 10/13/12 [phone]

## 2012-10-12 NOTE — ED Notes (Signed)
Left foot pain, states she can hardly walk after she sits for a period of time

## 2012-11-07 ENCOUNTER — Encounter (INDEPENDENT_AMBULATORY_CARE_PROVIDER_SITE_OTHER): Payer: Self-pay | Admitting: Internal Medicine

## 2012-11-07 ENCOUNTER — Ambulatory Visit (INDEPENDENT_AMBULATORY_CARE_PROVIDER_SITE_OTHER): Payer: Medicare Other | Admitting: Internal Medicine

## 2012-11-07 VITALS — BP 150/50 | HR 80 | Temp 97.9°F | Ht 62.0 in | Wt 157.9 lb

## 2012-11-07 DIAGNOSIS — K5090 Crohn's disease, unspecified, without complications: Secondary | ICD-10-CM

## 2012-11-07 NOTE — Progress Notes (Signed)
Subjective:     Patient ID: Mare Ferrari, female   DOB: Aug 29, 1935, 77 y.o.   MRN: [id number]  HPI Zaiyah is a 77 yr old female here today for a scheduled visit.  She tells me she is doing good. She stays on the go. She lost her husband in September due to cancer. She continues to work at [employer]. She empties her colostomy bag about every 2 1/2 hrs while she is at work. Stools brown in color. No melena or bright red rectal bleeding. No abdominal pain.   11/02/2011 Office visit: HPI  She has a hx of Crohn's disease status post subtotal colectomy with ileostomy in 2002. Marland Kitchen She had a flexible sigmoidoscopy back in September 2006 because of intermittent rectal rectal discharge that at times was blood tinged. She had acute proctitis and sigmoiditis on the distal sigmoid rim. 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 Biopsy revealed lymphoplasmacytic colitis. Last colonoscopy 06/2011: Markedly inflamed colonic tissue showing patchy acute inflammatory infiltrates. Lymphoid follicular hyperplasia, and scattered plasmacytic.  Review of Systems see hpi Current Outpatient Prescriptions  Medication Sig Dispense Refill  . acetaminophen (TYLENOL) 500 MG tablet Take 500 mg by mouth daily as needed.      Marland Kitchen amLODipine (NORVASC) 10 MG tablet Take 10 mg by mouth every evening.       Marland Kitchen levothyroxine (SYNTHROID, LEVOTHROID) 25 MCG tablet Take 25 mcg by mouth daily.      . Multiple Vitamin (MULTIVITAMIN) tablet Take 1 tablet by mouth every morning.       . naproxen sodium (ANAPROX) 220 MG tablet Take 220 mg by mouth 2 (two) times daily with a meal.      . Omega-3 Fatty Acids (FISH OIL) 1000 MG CAPS Take 5 capsules by mouth every morning.       . valsartan (DIOVAN) 320 MG tablet Take 320 mg by mouth daily.      . naproxen sodium (ALEVE) 220 MG tablet Take 220 mg by mouth daily as needed. For pain      . UNKNOWN TO PATIENT Take 1 tablet by mouth daily. THYROID: name is unknown       Past Medical History   Diagnosis Date  . Crohn's   . Hypertension    Past Surgical History  Procedure Date  . Appendectomy   . Tubal ligation   . Nasal sinus surgery   . Ileostomy    Allergies  Allergen Reactions  . Penicillins     rash  . Tape             Objective:   Physical Exam Filed Vitals:   11/07/12 1031  BP: 150/50  Pulse: 80  Temp: 97.9 F (36.6 C)  Height: 5' 2"  (1.575 m)  Weight: 157 lb 14.4 oz (71.623 kg)   Alert and oriented. Skin warm and dry. Oral mucosa is moist.   . Sclera anicteric, conjunctivae is pink. Thyroid not enlarged. No cervical lymphadenopathy. Lungs clear. Heart regular rate and rhythm.  Abdomen is soft. Bowel sounds are positive. No hepatomegaly. No abdominal masses felt. No tenderness.  No edema to lower extremities.       Assessment:    Crohn's disease which appears to be in remission. She is doing well.    Plan:    OV in 1 yr

## 2012-11-07 NOTE — Patient Instructions (Addendum)
OV in 1 year.  

## 2013-03-27 ENCOUNTER — Other Ambulatory Visit (HOSPITAL_COMMUNITY): Payer: Self-pay | Admitting: Internal Medicine

## 2013-03-27 DIAGNOSIS — M25561 Pain in right knee: Secondary | ICD-10-CM

## 2013-03-28 ENCOUNTER — Ambulatory Visit (HOSPITAL_COMMUNITY)
Admission: RE | Admit: 2013-03-28 | Discharge: 2013-03-28 | Disposition: A | Discharge status: Home / Self Care | Payer: Medicare Other | Source: Ambulatory Visit | Attending: Internal Medicine | Admitting: Internal Medicine

## 2013-03-28 ENCOUNTER — Ambulatory Visit (HOSPITAL_COMMUNITY): Payer: Medicare Other

## 2013-03-28 DIAGNOSIS — M25561 Pain in right knee: Secondary | ICD-10-CM

## 2013-03-28 DIAGNOSIS — S8990XA Unspecified injury of unspecified lower leg, initial encounter: Secondary | ICD-10-CM | POA: Insufficient documentation

## 2013-03-28 DIAGNOSIS — W19XXXA Unspecified fall, initial encounter: Secondary | ICD-10-CM | POA: Insufficient documentation

## 2013-03-28 DIAGNOSIS — M7120 Synovial cyst of popliteal space [Baker], unspecified knee: Secondary | ICD-10-CM | POA: Insufficient documentation

## 2013-03-28 DIAGNOSIS — M25569 Pain in unspecified knee: Secondary | ICD-10-CM | POA: Insufficient documentation

## 2013-04-10 ENCOUNTER — Ambulatory Visit (HOSPITAL_COMMUNITY)
Admission: RE | Admit: 2013-04-10 | Discharge: 2013-04-10 | Disposition: A | Discharge status: Home / Self Care | Payer: Medicare Other | Source: Ambulatory Visit | Attending: Internal Medicine | Admitting: Internal Medicine

## 2013-04-10 ENCOUNTER — Other Ambulatory Visit (HOSPITAL_COMMUNITY): Payer: Self-pay | Admitting: Internal Medicine

## 2013-04-10 DIAGNOSIS — W19XXXA Unspecified fall, initial encounter: Secondary | ICD-10-CM | POA: Insufficient documentation

## 2013-04-10 DIAGNOSIS — M25561 Pain in right knee: Secondary | ICD-10-CM

## 2013-04-10 DIAGNOSIS — S99919A Unspecified injury of unspecified ankle, initial encounter: Secondary | ICD-10-CM | POA: Insufficient documentation

## 2013-04-10 DIAGNOSIS — M25569 Pain in unspecified knee: Secondary | ICD-10-CM | POA: Insufficient documentation

## 2013-04-10 DIAGNOSIS — S8990XA Unspecified injury of unspecified lower leg, initial encounter: Secondary | ICD-10-CM | POA: Insufficient documentation

## 2013-04-17 ENCOUNTER — Ambulatory Visit (INDEPENDENT_AMBULATORY_CARE_PROVIDER_SITE_OTHER): Payer: Medicare Other | Admitting: Orthopedic Surgery

## 2013-04-17 ENCOUNTER — Encounter: Payer: Self-pay | Admitting: Orthopedic Surgery

## 2013-04-17 VITALS — BP 146/62 | Ht 62.0 in | Wt 161.0 lb

## 2013-04-17 DIAGNOSIS — M1710 Unilateral primary osteoarthritis, unspecified knee: Secondary | ICD-10-CM

## 2013-04-17 DIAGNOSIS — M1711 Unilateral primary osteoarthritis, right knee: Secondary | ICD-10-CM | POA: Insufficient documentation

## 2013-04-17 NOTE — Patient Instructions (Addendum)
Wear and Tear Disorders of the Knee (Arthritis, Osteoarthritis) Everyone will experience wear and tear injuries (arthritis, osteoarthritis) of the knee. These are the changes we all get as we age. They come from the joint stress of daily living. The amount of cartilage damage in your knee and your symptoms determine if you need surgery. Mild problems require approximately two months recovery time. More severe problems take several months to recover. With mild problems, your surgeon may find worn and rough cartilage surfaces. With severe changes, your surgeon may find cartilage that has completely worn away and exposed the bone. Loose bodies of bone and cartilage, bone spurs (excess bone growth), and injuries to the menisci (cushions between the large bones of your leg) are also common. All of these problems can cause pain. For a mild wear and tear problem, rough cartilage may simply need to be shaved and smoothed. For more severe problems with areas of exposed bone, your surgeon may use an instrument for roughing up the bone surfaces to stimulate new cartilage growth. Loose bodies are usually removed. Torn menisci may be trimmed or repaired. ABOUT THE ARTHROSCOPIC PROCEDURE Arthroscopy is a surgical technique. It allows your orthopedic surgeon to diagnose and treat your knee injury with accuracy. The surgeon looks into your knee through a small scope. The scope is like a small (pencil-sized) telescope. Arthroscopy is less invasive than open knee surgery. You can expect a more rapid recovery. After the procedure, you will be moved to a recovery area until most of the effects of the medication have worn off. Your caregiver will discuss the test results with you. RECOVERY The severity of the arthritis and the type of procedure performed will determine recovery time. Other important factors include age, physical condition, medical conditions, and the type of rehabilitation program. Strengthening your muscles after  arthroscopy helps guarantee a better recovery. Follow your caregiver's instructions. Use crutches, rest, elevate, ice, and do knee exercises as instructed. Your caregivers will help you and instruct you with exercises and other physical therapy required to regain your mobility, muscle strength, and functioning following surgery. Only take over-the-counter or prescription medicines for pain, discomfort, or fever as directed by your caregiver.  SEEK MEDICAL CARE IF:   There is increased bleeding (more than a small spot) from the wound.  You notice redness, swelling, or increasing pain in the wound.  Pus is coming from wound.  You develop an unexplained oral temperature above 102 F (38.9 C) , or as your caregiver suggests.  You notice a foul smell coming from the wound or dressing.  You have severe pain with motion of the knee. SEEK IMMEDIATE MEDICAL CARE IF:   You develop a rash.  You have difficulty breathing.  You have any allergic problems. MAKE SURE YOU:   Understand these instructions.  Will watch your condition.  Will get help right away if you are not doing well or get worse. Document Released: 11/12/2000 Document Revised: 02/07/2012 Document Reviewed: 04/10/2008 [facility] Patient Information 2013 [place]. Knee Injection Joint injections are shots. Your caregiver will place a needle into your knee joint. The needle is used to put medicine into the joint. These shots can be used to help treat different painful knee conditions such as osteoarthritis, bursitis, local flare-ups of rheumatoid arthritis, and pseudogout. Anti-inflammatory medicines such as corticosteroids and anesthetics are the most common medicines used for joint and soft tissue injections.  PROCEDURE  The skin over the kneecap will be cleaned with an antiseptic solution.  Your caregiver will inject a small amount of a local anesthetic (a medicine like Novocaine) just under the skin in the area that was  cleaned.  After the area becomes numb, a second injection is done. This second injection usually includes an anesthetic and an anti-inflammatory medicine called a steroid or cortisone. The needle is carefully placed in between the kneecap and the knee, and the medicine is injected into the joint space.  After the injection is done, the needle is removed. Your caregiver may place a bandage over the injection site. The whole procedure takes no more than a couple of minutes. BEFORE THE PROCEDURE  Wash all of the skin around the entire knee area. Try to remove any loose, scaling skin. There is no other specific preparation necessary unless advised otherwise by your caregiver. LET YOUR CAREGIVER KNOW ABOUT:   Allergies.  Medications taken including herbs, eye drops, over the counter medications, and creams.  Use of steroids (by mouth or creams).  Possible pregnancy, if applicable.  Previous problems with anesthetics or Novocaine.  History of blood clots (thrombophlebitis).  History of bleeding or blood problems.  Previous surgery.  Other health problems. RISKS AND COMPLICATIONS Side effects from cortisone shots are rare. They include:   Slight bruising of the skin.  Shrinkage of the normal fatty tissue under the skin where the shot was given.  Increase in pain after the shot.  Infection.  Weakening of tendons or tendon rupture.  Allergic reaction to the medicine.  Diabetics may have a temporary increase in their blood sugar after a shot.  Cortisone can temporarily weaken the immune system. While receiving these shots, you should not get certain vaccines. Also, avoid contact with anyone who has chickenpox or measles. Especially if you have never had these diseases or have not been previously immunized. Your immune system may not be strong enough to fight off the infection while the cortisone is in your system. AFTER THE PROCEDURE   You can go home after the procedure.  You  may need to put ice on the joint 15 to 20 minutes every 3 or 4 hours until the pain goes away.  You may need to put an elastic bandage on the joint. HOME CARE INSTRUCTIONS   Only take over-the-counter or prescription medicines for pain, discomfort, or fever as directed by your caregiver.  You should avoid stressing the joint. Unless advised otherwise, avoid activities that put a lot of pressure on a knee joint, such as:  Jogging.  Bicycling.  Recreational climbing.  Hiking.  Laying down and elevating the leg/knee above the level of your heart can help to minimize swelling. SEEK MEDICAL CARE IF:   You have repeated or worsening swelling.  There is drainage from the puncture area.  You develop red streaking that extends above or below the site where the needle was inserted. SEEK IMMEDIATE MEDICAL CARE IF:   You develop a fever.  You have pain that gets worse even though you are taking pain medicine.  The area is red and warm, and you have trouble moving the joint. MAKE SURE YOU:   Understand these instructions.  Will watch your condition.  Will get help right away if you are not doing well or get worse. Document Released: 02/06/2007 Document Revised: 02/07/2012 Document Reviewed: 11/03/2007 [facility] Patient Information 2013 [place].

## 2013-04-17 NOTE — Progress Notes (Signed)
Patient ID: Nicole Cochran, female   DOB: Dec 21, 1934, 78 y.o.   MRN: [id number] Chief Complaint  Patient presents with  . Knee Pain    Right knee pain, Referred by Dr. Merlyn Albert    History 78 year old female still working at [employer] and fell on April 22 was not having any pain prior to that but since then has a dull sharp pain over the medial aspect of her knee which she rates as 6/10 it's better when she's not walking. It seems that walking seems to make her pain worse she reports some locking some swelling no numbness or tingling no bruising. The pain is described as sharp to dull. She actually landed on the right side of the knee but has pain on the medial aspect. She's had an x-Malecha and an MRI her MRI shows that she has medial joint space narrowing and chondral changes consistent with osteoarthritis without fracture and she has some abnormalities in the medial meniscus but no tear there is also tendinosis with thickening of the semimembranosus tendon and a small Baker's cyst  She's here for evaluation and further treatment  Her review of systems is listed by her as normal. She says that she's got have some aches and pains at her age  She's allergic to penicillin and adhesive tape  She has some hypertension and Crohn's disease  Past Surgical History  Procedure Laterality Date  . Appendectomy    . Tubal ligation    . Nasal sinus surgery    . Ileostomy     Medications include amlodipine fish oil vitamins tramadol ibuprofen hydrochlorothiazide combination  She is widowed doesn't smoke or drink  BP 146/62  Ht 5' 2"  (1.575 m)  Wt 161 lb (73.029 kg)  BMI 29.44 kg/m2 General appearance is normal, the patient is alert and oriented x3 with normal mood and affect. She is ambulatory without assistive device doesn't have a significant limp  Her right knee is tender over the medial aspect without joint effusion her range of motion is maintained at 125 knee is stable her motor exam is normal skin is  intact  She has good pulses no edema normal sensation  She has a negative McMurray sign  Impression osteoarthritis with acute exacerbation after a traumatic fall  Recommend cortisone injection maintain normal lifestyle inactive lifestyle  Followup with [place] as needed  Knee  Injection Procedure Note  Pre-operative Diagnosis: right knee oa  Post-operative Diagnosis: same  Indications: pain  Anesthesia: ethyl chloride   Procedure Details   Verbal consent was obtained for the procedure. Time out was completed.The joint was prepped with alcohol, followed by  Ethyl chloride spray and A 20 gauge needle was inserted into the knee via lateral approach; 52m 1% lidocaine and 1 ml of depomedrol  was then injected into the joint . The needle was removed and the area cleansed and dressed.  Complications:  None; patient tolerated the procedure well.

## 2013-06-02 ENCOUNTER — Encounter (HOSPITAL_COMMUNITY): Payer: Self-pay | Admitting: *Deleted

## 2013-06-02 ENCOUNTER — Emergency Department (HOSPITAL_COMMUNITY): Payer: Medicare Other

## 2013-06-02 ENCOUNTER — Inpatient Hospital Stay (HOSPITAL_COMMUNITY)
Admission: EM | Admit: 2013-06-02 | Discharge: 2013-06-09 | DRG: 336 | Disposition: A | Discharge status: Home / Self Care | Payer: Medicare Other | Attending: General Surgery | Admitting: General Surgery

## 2013-06-02 DIAGNOSIS — K56609 Unspecified intestinal obstruction, unspecified as to partial versus complete obstruction: Secondary | ICD-10-CM

## 2013-06-02 DIAGNOSIS — R112 Nausea with vomiting, unspecified: Secondary | ICD-10-CM

## 2013-06-02 DIAGNOSIS — M129 Arthropathy, unspecified: Secondary | ICD-10-CM | POA: Diagnosis present

## 2013-06-02 DIAGNOSIS — K565 Intestinal adhesions [bands], unspecified as to partial versus complete obstruction: Principal | ICD-10-CM | POA: Diagnosis present

## 2013-06-02 DIAGNOSIS — I10 Essential (primary) hypertension: Secondary | ICD-10-CM | POA: Diagnosis present

## 2013-06-02 DIAGNOSIS — IMO0002 Reserved for concepts with insufficient information to code with codable children: Secondary | ICD-10-CM | POA: Diagnosis present

## 2013-06-02 DIAGNOSIS — Z9049 Acquired absence of other specified parts of digestive tract: Secondary | ICD-10-CM

## 2013-06-02 DIAGNOSIS — K435 Parastomal hernia without obstruction or  gangrene: Secondary | ICD-10-CM

## 2013-06-02 HISTORY — DX: Pain in right knee: M25.561

## 2013-06-02 HISTORY — DX: Cardiac murmur, unspecified: R01.1

## 2013-06-02 HISTORY — DX: Unspecified osteoarthritis, unspecified site: M19.90

## 2013-06-02 HISTORY — DX: Pneumonia, unspecified organism: J18.9

## 2013-06-02 LAB — CBC WITH DIFFERENTIAL/PLATELET
Eosinophils Relative: 0 % (ref 0–5)
HCT: 34.4 % — ABNORMAL LOW (ref 36.0–46.0)
Lymphocytes Relative: 7 % — ABNORMAL LOW (ref 12–46)
Lymphs Abs: 0.7 10*3/uL (ref 0.7–4.0)
MCV: 86.9 fL (ref 78.0–100.0)
Monocytes Absolute: 0.2 10*3/uL (ref 0.1–1.0)
Neutro Abs: 8.6 10*3/uL — ABNORMAL HIGH (ref 1.7–7.7)
RBC: 3.96 MIL/uL (ref 3.87–5.11)
WBC: 9.5 10*3/uL (ref 4.0–10.5)

## 2013-06-02 LAB — URINALYSIS W MICROSCOPIC + REFLEX CULTURE
Bilirubin Urine: NEGATIVE
Ketones, ur: NEGATIVE mg/dL
Urobilinogen, UA: 0.2 mg/dL (ref 0.0–1.0)

## 2013-06-02 LAB — COMPREHENSIVE METABOLIC PANEL
ALT: 11 U/L (ref 0–35)
AST: 14 U/L (ref 0–37)
CO2: 26 mEq/L (ref 19–32)
Calcium: 8.5 mg/dL (ref 8.4–10.5)
Chloride: 99 mEq/L (ref 96–112)
GFR calc Af Amer: >90 mL/min (ref >90)
GFR calc non Af Amer: >90 mL/min (ref >90)
Glucose, Bld: 122 mg/dL — ABNORMAL HIGH (ref 70–99)
Sodium: 132 mEq/L — ABNORMAL LOW (ref 135–145)
Total Bilirubin: 0.4 mg/dL (ref 0.3–1.2)

## 2013-06-02 MED ORDER — KCL IN DEXTROSE-NACL 40-5-0.45 MEQ/L-%-% IV SOLN
INTRAVENOUS | Status: DC
  Administered 2013-06-02 – 2013-06-04 (×5): via INTRAVENOUS

## 2013-06-02 MED ORDER — MORPHINE SULFATE 4 MG/ML IJ SOLN
4.0000 mg | INTRAMUSCULAR | Status: AC | PRN
  Administered 2013-06-02 (×2): 4 mg via INTRAVENOUS
  Filled 2013-06-02 (×2): qty 1

## 2013-06-02 MED ORDER — FAMOTIDINE IN NACL 20-0.9 MG/50ML-% IV SOLN
20.0000 mg | Freq: Once | INTRAVENOUS | Status: AC
  Administered 2013-06-02: 20 mg via INTRAVENOUS
  Filled 2013-06-02: qty 50

## 2013-06-02 MED ORDER — MENTHOL 3 MG MT LOZG: 1.0000 | LOZENGE | OROMUCOSAL | Status: DC | PRN

## 2013-06-02 MED ORDER — IOHEXOL 300 MG/ML  SOLN: 50.0000 mL | Freq: Once | INTRAMUSCULAR | Status: DC | PRN

## 2013-06-02 MED ORDER — ACETAMINOPHEN 650 MG RE SUPP: 650.0000 mg | Freq: Four times a day (QID) | RECTAL | Status: DC | PRN

## 2013-06-02 MED ORDER — PANTOPRAZOLE SODIUM 40 MG IV SOLR
40.0000 mg | Freq: Every day | INTRAVENOUS | Status: DC
  Administered 2013-06-02 – 2013-06-07 (×6): 40 mg via INTRAVENOUS
  Filled 2013-06-02 (×6): qty 40

## 2013-06-02 MED ORDER — IOHEXOL 300 MG/ML  SOLN
100.0000 mL | Freq: Once | INTRAMUSCULAR | Status: AC | PRN
  Administered 2013-06-02: 100 mL via INTRAVENOUS

## 2013-06-02 MED ORDER — IOHEXOL 300 MG/ML  SOLN
50.0000 mL | Freq: Once | INTRAMUSCULAR | Status: AC | PRN
  Administered 2013-06-02: 50 mL via ORAL

## 2013-06-02 MED ORDER — MORPHINE SULFATE 2 MG/ML IJ SOLN
2.0000 mg | INTRAMUSCULAR | Status: DC | PRN
  Administered 2013-06-02 – 2013-06-08 (×17): 2 mg via INTRAVENOUS
  Filled 2013-06-02 (×17): qty 1

## 2013-06-02 MED ORDER — ENOXAPARIN SODIUM 40 MG/0.4ML ~~LOC~~ SOLN
40.0000 mg | SUBCUTANEOUS | Status: DC
  Administered 2013-06-02 – 2013-06-03 (×2): 40 mg via SUBCUTANEOUS
  Filled 2013-06-02 (×2): qty 0.4

## 2013-06-02 MED ORDER — ONDANSETRON HCL 4 MG/2ML IJ SOLN
4.0000 mg | INTRAMUSCULAR | Status: DC | PRN
  Administered 2013-06-02: 4 mg via INTRAVENOUS
  Filled 2013-06-02: qty 2

## 2013-06-02 MED ORDER — ONDANSETRON HCL 4 MG/2ML IJ SOLN
4.0000 mg | Freq: Four times a day (QID) | INTRAMUSCULAR | Status: DC | PRN
  Administered 2013-06-02 – 2013-06-09 (×11): 4 mg via INTRAVENOUS
  Filled 2013-06-02 (×11): qty 2

## 2013-06-02 MED ORDER — ACETAMINOPHEN 325 MG PO TABS: 650.0000 mg | ORAL_TABLET | Freq: Four times a day (QID) | ORAL | Status: DC | PRN

## 2013-06-02 MED ORDER — SODIUM CHLORIDE 0.9 % IV SOLN
INTRAVENOUS | Status: DC
  Administered 2013-06-02: 12:00:00 via INTRAVENOUS

## 2013-06-02 MED ORDER — SODIUM CHLORIDE 0.9 % IV SOLN
INTRAVENOUS | Status: DC
  Administered 2013-06-02: 16:00:00 via INTRAVENOUS

## 2013-06-02 NOTE — ED Provider Notes (Signed)
History    CSN: [id number] Arrival date & time 06/02/13  1024  First MD Initiated Contact with Patient 06/02/13 1133     Chief Complaint  Patient presents with  . Abdominal Pain  . Nausea   HPI Pt was seen at 1150.   Per pt, c/o gradual onset and persistence of constant generalized abd "pain" since overnight last night.  Has been associated with multiple intermittent episodes of N/V.  Describes the abd pain as "cramping."  Pt and her family states "this happens every 6 to 8 months or so" without definitive dx.  Denies fevers, no back pain, no rash, no CP/palpitations, no SOB/cough, no black or blood in stools or emesis.      Past Medical History  Diagnosis Date  . Crohn's   . Hypertension   . Arthritis   . Right knee pain    Past Surgical History  Procedure Laterality Date  . Appendectomy    . Tubal ligation    . Nasal sinus surgery    . Ileostomy  2002  . Subtotal colectomy  2002    History  Substance Use Topics  . Smoking status: Never Smoker   . Smokeless tobacco: Not on file  . Alcohol Use: No    Review of Systems ROS: Statement: All systems negative except as marked or noted in the HPI; Constitutional: +generalized weakness/fatigue. Negative for fever and chills. ; ; Eyes: Negative for eye pain, redness and discharge. ; ; ENMT: Negative for ear pain, hoarseness, nasal congestion, sinus pressure and sore throat. ; ; Cardiovascular: Negative for chest pain, palpitations, diaphoresis, dyspnea and peripheral edema. ; ; Respiratory: Negative for cough, wheezing and stridor. ; ; Gastrointestinal: +N/V, abd pain. Negative for diarrhea, blood in stool, hematemesis, jaundice and rectal bleeding. . ; ; Genitourinary: Negative for dysuria, flank pain and hematuria. ; ; Musculoskeletal: Negative for back pain and neck pain. Negative for swelling and trauma.; ; Skin: Negative for pruritus, rash, abrasions, blisters, bruising and skin lesion.; ; Neuro: Negative for headache,  lightheadedness and neck stiffness. Negative for weakness, altered level of consciousness , altered mental status, extremity weakness, paresthesias, involuntary movement, seizure and syncope.       Allergies  Penicillins and Tape  Home Medications   Current Outpatient Rx  Name  Route  Sig  Dispense  Refill  . acetaminophen (TYLENOL) 500 MG tablet   Oral   Take 500 mg by mouth daily as needed.         Marland Kitchen amLODipine (NORVASC) 10 MG tablet   Oral   Take 10 mg by mouth every evening.          . Multiple Vitamin (MULTIVITAMIN) tablet   Oral   Take 1 tablet by mouth every morning.          . Multiple Vitamins-Minerals (MULTIVITAMINS THER. W/MINERALS) TABS   Oral   Take 1 tablet by mouth daily.         . naproxen sodium (ALEVE) 220 MG tablet   Oral   Take 220 mg by mouth daily as needed. For pain         . Omega-3 Fatty Acids (FISH OIL) 1000 MG CAPS   Oral   Take 2-3 capsules by mouth every morning.          . valsartan (DIOVAN) 320 MG tablet   Oral   Take 320 mg by mouth daily.          BP 141/63  Pulse 71  Temp(Src) 98.2 F (36.8 C) (Oral)  Resp 17  Ht 5' 2"  (1.575 m)  Wt 160 lb (72.576 kg)  BMI 29.26 kg/m2  SpO2 95% Physical Exam 1155: Physical examination:  Nursing notes reviewed; Vital signs and O2 SAT reviewed;  Constitutional: Well developed, Well nourished, Uncomfortable appearing,; Head:  Normocephalic, atraumatic; Eyes: EOMI, PERRL, No scleral icterus; ENMT: Mouth and pharynx normal, Mucous membranes moist; Neck: Supple, Full range of motion, No lymphadenopathy; Cardiovascular: Regular rate and rhythm, No gallop; Respiratory: Breath sounds clear & equal bilaterally, No rales, rhonchi, wheezes.  Speaking full sentences with ease, Normal respiratory effort/excursion; Chest: Nontender, Movement normal; Abdomen: Soft, +diffuse tenderness to palp. No rebound or guarding. +ileostomy bag with brown liquid stool, +parastomal hernia. Nondistended,  Hyperactive bowel sounds; Genitourinary: No CVA tenderness; Extremities: Pulses normal, No tenderness, No edema, No calf edema or asymmetry.; Neuro: AA&Ox3, Major CN grossly intact.  Speech clear. No gross focal motor or sensory deficits in extremities.; Skin: Color normal, Warm, Dry.   ED Course  Procedures    MDM  MDM Reviewed: previous chart, nursing note and vitals Reviewed previous: labs and ECG Interpretation: labs, ECG and x-[place]    Date: 06/02/2013  Rate: 70  Rhythm: normal sinus rhythm  QRS Axis: normal  Intervals: PR prolonged  ST/T Wave abnormalities: nonspecific ST/T changes  Conduction Disutrbances:first-degree A-V block   Narrative Interpretation:   Old EKG Reviewed: unchanged; no significant changes from previous EKG dated 01/27/2012.  Results for orders placed during the hospital encounter of 06/02/13  URINALYSIS W MICROSCOPIC + REFLEX CULTURE      Result Value Range   Color, Urine YELLOW  YELLOW   APPearance CLEAR  CLEAR   Specific Gravity, Urine 1.015  1.005 - 1.030   pH 7.5  5.0 - 8.0   Glucose, UA NEGATIVE  NEGATIVE mg/dL   Hgb urine dipstick TRACE (*) NEGATIVE   Bilirubin Urine NEGATIVE  NEGATIVE   Ketones, ur NEGATIVE  NEGATIVE mg/dL   Protein, ur TRACE (*) NEGATIVE mg/dL   Urobilinogen, UA 0.2  0.0 - 1.0 mg/dL   Nitrite NEGATIVE  NEGATIVE   Leukocytes, UA TRACE (*) NEGATIVE   WBC, UA 3-6  <3 WBC/hpf   RBC / HPF 3-6  <3 RBC/hpf   Bacteria, UA FEW (*) RARE   Squamous Epithelial / LPF RARE  RARE  CBC WITH DIFFERENTIAL      Result Value Range   WBC 9.5  4.0 - 10.5 K/uL   RBC 3.96  3.87 - 5.11 MIL/uL   Hemoglobin 12.1  12.0 - 15.0 g/dL   HCT 34.4 (*) 36.0 - 46.0 %   MCV 86.9  78.0 - 100.0 fL   MCH 30.6  26.0 - 34.0 pg   MCHC 35.2  30.0 - 36.0 g/dL   RDW 12.3  11.5 - 15.5 %   Platelets 235  150 - 400 K/uL   Neutrophils Relative % 91 (*) 43 - 77 %   Neutro Abs 8.6 (*) 1.7 - 7.7 K/uL   Lymphocytes Relative 7 (*) 12 - 46 %   Lymphs Abs 0.7  0.7 -  4.0 K/uL   Monocytes Relative 2 (*) 3 - 12 %   Monocytes Absolute 0.2  0.1 - 1.0 K/uL   Eosinophils Relative 0  0 - 5 %   Eosinophils Absolute 0.0  0.0 - 0.7 K/uL   Basophils Relative 0  0 - 1 %   Basophils Absolute 0.0  0.0 - 0.1 K/uL  COMPREHENSIVE METABOLIC  PANEL      Result Value Range   Sodium 132 (*) 135 - 145 mEq/L   Potassium 3.3 (*) 3.5 - 5.1 mEq/L   Chloride 99  96 - 112 mEq/L   CO2 26  19 - 32 mEq/L   Glucose, Bld 122 (*) 70 - 99 mg/dL   BUN 7  6 - 23 mg/dL   Creatinine, Ser 0.49 (*) 0.50 - 1.10 mg/dL   Calcium 8.5  8.4 - 10.5 mg/dL   Total Protein 6.8  6.0 - 8.3 g/dL   Albumin 3.6  3.5 - 5.2 g/dL   AST 14  0 - 37 U/L   ALT 11  0 - 35 U/L   Alkaline Phosphatase 62  39 - 117 U/L   Total Bilirubin 0.4  0.3 - 1.2 mg/dL   GFR calc non Af Amer >90  >90 mL/min   GFR calc Af Amer >90  >90 mL/min  LIPASE, BLOOD      Result Value Range   Lipase 31  11 - 59 U/L  TROPONIN I      Result Value Range   Troponin I <0.30  <0.30 ng/mL   Dg Abd Acute W/chest 06/02/2013   *RADIOLOGY REPORT*  Clinical Data: Abdominal pain.  Nausea  ACUTE ABDOMEN SERIES (ABDOMEN 2 VIEW & CHEST 1 VIEW)  Comparison: None.  Findings: Normal heart size.  There is no pleural effusion or edema.  No airspace consolidation identified.  There are multiple dilated loops of small bowel which measure up to 4.6 cm.  No fluid levels identified.  There is no free intraperitoneal air.  IMPRESSION:  1.  Abnormal small bowel dilatation.  Cannot rule out obstruction.   Original Report Authenticated By: Kerby Moors, M.D.    Ct Abdomen Pelvis W Contrast 06/02/2013   *RADIOLOGY REPORT*  Clinical Data: Abdominal pain, nausea and vomiting with history of Crohn disease  CT ABDOMEN AND PELVIS WITH CONTRAST  Technique:  Multidetector CT imaging of the abdomen and pelvis was performed following the standard protocol during bolus administration of intravenous contrast.  Contrast: 21m OMNIPAQUE IOHEXOL 300 MG/ML  SOLN, 1071mOMNIPAQUE  IOHEXOL 300 MG/ML  SOLN  Comparison: Acute abdominal radiographs earlier today at 12:17 p.m.  Findings:  Lower Chest:  Mild dependent atelectasis bilaterally.  The lung bases are otherwise clear.  The visualized cardiac structures within normal limits for size.  No pericardial effusion.  Small hiatal hernia.  Abdomen: Unremarkable CT appearance of the stomach, duodenum, spleen, adrenal glands and pancreas.  Normal hepatic morphology and contours.  No focal hepatic lesion.  Nonspecific low attenuation small volume perihepatic ascites. Gallbladder is unremarkable. No intra or extrahepatic biliary ductal dilatation.  Symmetric renal parenchymal enhancement bilaterally.  There are several low attenuation sub centimeter renal lesions bilaterally which are too small to characterize but statistically highly likely benign cysts.  No hydronephrosis or nephrolithiasis. Numerous loops of dilated small bowel containing air fluid levels consistent with small bowel obstruction.  No definite contrast material past the obstruction point.  The transition is in the right hemiabdomen underlying the right ileostomy and parastomal hernia at the superior margin of the hernia defect. The transition point is marked with arrows on the axial, coronal and sagittal reformats.  Parastomal hernia contains a loop of enlarged and dilated small bowel, however there is no obstruction of this specific loop of bowel.  There is associated interloop fluid within the parastomal hernia and within the abdomen.  The more distal small bowel is  decompressed.  The residual Hartmann's pouch is atrophic.  Surgical changes of sub total colectomy.  Pelvis: Unremarkable bladder, uterus and adnexa.  No significant free fluid in the pelvis.  No suspicious adenopathy.  Bones: No acute fracture or aggressive appearing lytic or blastic osseous lesion. Lower lumbar degenerative disc disease and facet arthropathy.  Vascular: Mild atherosclerotic vascular disease without  aneurysmal dilatation.  No focal venous abnormality identified.  IMPRESSION:  1.  High-grade small bowel obstruction with the transition point (marked with arrows) the underlying the superior margin of the right lower quadrant end ileostomy and associated parastomal hernia.  The etiology of the obstruction is most likely underlying adhesive disease.  Given the adjacent parastomal hernia, an internal hernia is a less likely possibility.  2. Surgical changes of sub total colectomy with right lower quadrant end ileostomy complicated by a large peristomal hernia containing a loop of the obstructed small bowel. Although this loop of bowel is obstructed from the more distal obstruction, it is does not appear incarcerated or obstructed in and of itself.  3.  Interloop and small perihepatic ascites.  4.  Additional ancillary findings as above.   Original Report Authenticated By: Jacqulynn Cadet, M.D.     1615:  Pt continues to c/o N/V, will re-dose IV zofran. +SBO distal to parastomal hernia; hernia itself does not have obstruction. Remains afebrile with stable VS. Dx and testing d/w pt and family.  Questions answered.  Verb understanding, agreeable to admit.   T/C to General Surgery Dr. Arnoldo Morale, case discussed, including:  HPI, pertinent PM/SHx, VS/PE, dx testing, ED course and treatment:  Agreeable to admit, requests he will come to ED for eval.          Alfonzo Feller, DO 06/03/13 6213

## 2013-06-02 NOTE — ED Notes (Signed)
Pt states abdominal pain and nausea. Pain began at ~0300. Pt has colostomy but denies any more drainage than usual.

## 2013-06-02 NOTE — H&P (Signed)
Nicole Cochran is an 78 y.o. female.   Chief Complaint: Nausea and vomiting HPI: Patient is a 78 year old white female status post subtotal colectomy with ileostomy in 2002 at [facility] for Crohn disease who presents with a 12 hour history of nausea and vomiting. She states she was doing well yesterday evening but started developing nausea, vomiting, abdominal pain overnight. She states she has not had to empty her ileostomy bag like she usually does. CT scan the abdomen and pelvis was performed which reveals a small bowel obstruction distal to the parastomal hernia most likely secondary to adhesive disease. Patient's abdominal pain is crampy in nature.  Past Medical History  Diagnosis Date  . Crohn's   . Hypertension   . Arthritis   . Right knee pain     Past Surgical History  Procedure Laterality Date  . Appendectomy    . Tubal ligation    . Nasal sinus surgery    . Ileostomy  2002  . Subtotal colectomy  2002    No family history on file. Social History:  reports that she has never smoked. She does not have any smokeless tobacco history on file. She reports that she does not drink alcohol or use illicit drugs.  Allergies:  Allergies  Allergen Reactions  . Penicillins     rash  . Tape      (Not in a hospital admission)  Results for orders placed during the hospital encounter of 06/02/13 (from the past 48 hour(s))  URINALYSIS W MICROSCOPIC + REFLEX CULTURE     Status: Abnormal   Collection Time    06/02/13 12:23 PM      Result Value Range   Color, Urine YELLOW  YELLOW   APPearance CLEAR  CLEAR   Specific Gravity, Urine 1.015  1.005 - 1.030   pH 7.5  5.0 - 8.0   Glucose, UA NEGATIVE  NEGATIVE mg/dL   Hgb urine dipstick TRACE (*) NEGATIVE   Bilirubin Urine NEGATIVE  NEGATIVE   Ketones, ur NEGATIVE  NEGATIVE mg/dL   Protein, ur TRACE (*) NEGATIVE mg/dL   Urobilinogen, UA 0.2  0.0 - 1.0 mg/dL   Nitrite NEGATIVE  NEGATIVE   Leukocytes, UA TRACE (*) NEGATIVE    WBC, UA 3-6  <3 WBC/hpf   RBC / HPF 3-6  <3 RBC/hpf   Bacteria, UA FEW (*) RARE   Squamous Epithelial / LPF RARE  RARE  CBC WITH DIFFERENTIAL     Status: Abnormal   Collection Time    06/02/13  1:04 PM      Result Value Range   WBC 9.5  4.0 - 10.5 K/uL   RBC 3.96  3.87 - 5.11 MIL/uL   Hemoglobin 12.1  12.0 - 15.0 g/dL   HCT 34.4 (*) 36.0 - 46.0 %   MCV 86.9  78.0 - 100.0 fL   MCH 30.6  26.0 - 34.0 pg   MCHC 35.2  30.0 - 36.0 g/dL   RDW 12.3  11.5 - 15.5 %   Platelets 235  150 - 400 K/uL   Neutrophils Relative % 91 (*) 43 - 77 %   Neutro Abs 8.6 (*) 1.7 - 7.7 K/uL   Lymphocytes Relative 7 (*) 12 - 46 %   Lymphs Abs 0.7  0.7 - 4.0 K/uL   Monocytes Relative 2 (*) 3 - 12 %   Monocytes Absolute 0.2  0.1 - 1.0 K/uL   Eosinophils Relative 0  0 - 5 %   Eosinophils  Absolute 0.0  0.0 - 0.7 K/uL   Basophils Relative 0  0 - 1 %   Basophils Absolute 0.0  0.0 - 0.1 K/uL  COMPREHENSIVE METABOLIC PANEL     Status: Abnormal   Collection Time    06/02/13  1:04 PM      Result Value Range   Sodium 132 (*) 135 - 145 mEq/L   Potassium 3.3 (*) 3.5 - 5.1 mEq/L   Chloride 99  96 - 112 mEq/L   CO2 26  19 - 32 mEq/L   Glucose, Bld 122 (*) 70 - 99 mg/dL   BUN 7  6 - 23 mg/dL   Creatinine, Ser 0.49 (*) 0.50 - 1.10 mg/dL   Calcium 8.5  8.4 - 10.5 mg/dL   Total Protein 6.8  6.0 - 8.3 g/dL   Albumin 3.6  3.5 - 5.2 g/dL   AST 14  0 - 37 U/L   ALT 11  0 - 35 U/L   Alkaline Phosphatase 62  39 - 117 U/L   Total Bilirubin 0.4  0.3 - 1.2 mg/dL   GFR calc non Af Amer >90  >90 mL/min   GFR calc Af Amer >90  >90 mL/min   Comment:            The eGFR has been calculated     using the CKD EPI equation.     This calculation has not been     validated in all clinical     situations.     eGFR's persistently     <90 mL/min signify     possible Chronic Kidney Disease.  LIPASE, BLOOD     Status: None   Collection Time    06/02/13  1:04 PM      Result Value Range   Lipase 31  11 - 59 U/L  TROPONIN I      Status: None   Collection Time    06/02/13  1:04 PM      Result Value Range   Troponin I <0.30  <0.30 ng/mL   Comment:            Due to the release kinetics of cTnI,     a negative result within the first hours     of the onset of symptoms does not rule out     myocardial infarction with certainty.     If myocardial infarction is still suspected,     repeat the test at appropriate intervals.   Ct Abdomen Pelvis W Contrast  06/02/2013   *RADIOLOGY REPORT*  Clinical Data: Abdominal pain, nausea and vomiting with history of Crohn disease  CT ABDOMEN AND PELVIS WITH CONTRAST  Technique:  Multidetector CT imaging of the abdomen and pelvis was performed following the standard protocol during bolus administration of intravenous contrast.  Contrast: 64m OMNIPAQUE IOHEXOL 300 MG/ML  SOLN, 1080mOMNIPAQUE IOHEXOL 300 MG/ML  SOLN  Comparison: Acute abdominal radiographs earlier today at 12:17 p.m.  Findings:  Lower Chest:  Mild dependent atelectasis bilaterally.  The lung bases are otherwise clear.  The visualized cardiac structures within normal limits for size.  No pericardial effusion.  Small hiatal hernia.  Abdomen: Unremarkable CT appearance of the stomach, duodenum, spleen, adrenal glands and pancreas.  Normal hepatic morphology and contours.  No focal hepatic lesion.  Nonspecific low attenuation small volume perihepatic ascites. Gallbladder is unremarkable. No intra or extrahepatic biliary ductal dilatation.  Symmetric renal parenchymal enhancement bilaterally.  There are several low attenuation sub centimeter  renal lesions bilaterally which are too small to characterize but statistically highly likely benign cysts.  No hydronephrosis or nephrolithiasis. Numerous loops of dilated small bowel containing air fluid levels consistent with small bowel obstruction.  No definite contrast material past the obstruction point.  The transition is in the right hemiabdomen underlying the right ileostomy and  parastomal hernia at the superior margin of the hernia defect. The transition point is marked with arrows on the axial, coronal and sagittal reformats.  Parastomal hernia contains a loop of enlarged and dilated small bowel, however there is no obstruction of this specific loop of bowel.  There is associated interloop fluid within the parastomal hernia and within the abdomen.  The more distal small bowel is decompressed.  The residual Hartmann's pouch is atrophic.  Surgical changes of sub total colectomy.  Pelvis: Unremarkable bladder, uterus and adnexa.  No significant free fluid in the pelvis.  No suspicious adenopathy.  Bones: No acute fracture or aggressive appearing lytic or blastic osseous lesion. Lower lumbar degenerative disc disease and facet arthropathy.  Vascular: Mild atherosclerotic vascular disease without aneurysmal dilatation.  No focal venous abnormality identified.  IMPRESSION:  1.  High-grade small bowel obstruction with the transition point (marked with arrows) the underlying the superior margin of the right lower quadrant end ileostomy and associated parastomal hernia.  The etiology of the obstruction is most likely underlying adhesive disease.  Given the adjacent parastomal hernia, an internal hernia is a less likely possibility.  2. Surgical changes of sub total colectomy with right lower quadrant end ileostomy complicated by a large peristomal hernia containing a loop of the obstructed small bowel. Although this loop of bowel is obstructed from the more distal obstruction, it is does not appear incarcerated or obstructed in and of itself.  3.  Interloop and small perihepatic ascites.  4.  Additional ancillary findings as above.   Original Report Authenticated By: Jacqulynn Cadet, M.D.   Dg Abd Acute W/chest  06/02/2013   *RADIOLOGY REPORT*  Clinical Data: Abdominal pain.  Nausea  ACUTE ABDOMEN SERIES (ABDOMEN 2 VIEW & CHEST 1 VIEW)  Comparison: None.  Findings: Normal heart size.  There is  no pleural effusion or edema.  No airspace consolidation identified.  There are multiple dilated loops of small bowel which measure up to 4.6 cm.  No fluid levels identified.  There is no free intraperitoneal air.  IMPRESSION:  1.  Abnormal small bowel dilatation.  Cannot rule out obstruction.   Original Report Authenticated By: Kerby Moors, M.D.    Review of Systems  Gastrointestinal: Positive for nausea, vomiting and abdominal pain.  All other systems reviewed and are negative.    Blood pressure 133/59, pulse 63, temperature 98.2 F (36.8 C), temperature source Oral, resp. rate 12, height 5' 2"  (1.575 m), weight 72.576 kg (160 lb), SpO2 92.00%. Physical Exam  Constitutional: She is oriented to person, place, and time. She appears well-developed and well-nourished. No distress.  HENT:  Head: Normocephalic and atraumatic.  Neck: Normal range of motion. Neck supple.  Cardiovascular: Normal rate, regular rhythm and normal heart sounds.   Respiratory: Effort normal and breath sounds normal.  GI: Soft. She exhibits distension.  Ileostomy present in right lower quadrant with parastomal hernia present. Abdomen is distended but not tense. No rigidity is noted. Bowel sounds appreciated.  Neurological: She is alert and oriented to person, place, and time.  Skin: Skin is warm and dry.     Assessment/Plan Impression: Small bowel obstruction secondary to  adhesive disease, history of Crohn's disease. Plan: Patient admitted to the hospital for nasogastric tube decompression and IV hydration. She does not need acute surgical intervention at this time, though I did explain to the patient that she may accept going undergo an exploratory laparotomy should this not resolve with conservative measures. She understands and agrees to the treatment plan.  Basma Buchner A 06/02/2013, 5:13 PM

## 2013-06-02 NOTE — ED Notes (Signed)
Pt c/o generalized weakness, hx of same about every 6-8 months due to dehydration per pt and pt's daughter

## 2013-06-03 LAB — BASIC METABOLIC PANEL
CO2: 24 mEq/L (ref 19–32)
Calcium: 8.7 mg/dL (ref 8.4–10.5)
Glucose, Bld: 148 mg/dL — ABNORMAL HIGH (ref 70–99)
Sodium: 132 mEq/L — ABNORMAL LOW (ref 135–145)

## 2013-06-03 LAB — SURGICAL PCR SCREEN: Staphylococcus aureus: NEGATIVE

## 2013-06-03 LAB — CBC
HCT: 35.1 % — ABNORMAL LOW (ref 36.0–46.0)
Hemoglobin: 12.6 g/dL (ref 12.0–15.0)
MCHC: 35.9 g/dL (ref 30.0–36.0)
RBC: 4.02 MIL/uL (ref 3.87–5.11)
WBC: 11.2 10*3/uL — ABNORMAL HIGH (ref 4.0–10.5)

## 2013-06-03 LAB — URINE CULTURE: Colony Count: 15000

## 2013-06-03 LAB — PHOSPHORUS: Phosphorus: 1.9 mg/dL — ABNORMAL LOW (ref 2.3–4.6)

## 2013-06-03 MED ORDER — AMLODIPINE BESYLATE 5 MG PO TABS
10.0000 mg | ORAL_TABLET | Freq: Once | ORAL | Status: AC
  Administered 2013-06-03: 10 mg via ORAL
  Filled 2013-06-03: qty 2

## 2013-06-03 MED ORDER — AMLODIPINE BESYLATE 5 MG PO TABS: 10.0000 mg | ORAL_TABLET | Freq: Once | ORAL | Status: DC

## 2013-06-03 MED ORDER — CHLORHEXIDINE GLUCONATE 4 % EX LIQD
1.0000 "application " | Freq: Once | CUTANEOUS | Status: AC
  Administered 2013-06-03: 1 via TOPICAL
  Filled 2013-06-03: qty 15

## 2013-06-03 NOTE — Progress Notes (Signed)
Dr. Arnoldo Morale returned page.  New order received for Norvasc 10 mg PO, clamp NGT for 30 minutes after.  Will make patient and night shift RN aware.

## 2013-06-03 NOTE — Progress Notes (Signed)
Subjective: No flatus or bowel movement yet. Patient still has crampy abdominal pain.  Objective: Vital signs in last 24 hours: Temp:  [97.8 F (36.6 C)-98.8 F (37.1 C)] 98.8 F (37.1 C) (07/06 0636) Pulse Rate:  [55-71] 60 (07/06 0636) Resp:  [12-18] 18 (07/06 0636) BP: (122-174)/(56-92) 163/66 mmHg (07/06 0636) SpO2:  [92 %-99 %] 95 % (07/06 0636) Weight:  [72.576 kg (160 lb)-75.6 kg (166 lb 10.7 oz)] 75.6 kg (166 lb 10.7 oz) (07/05 1855) Last BM Date: 06/01/13  Intake/Output from previous day: 07/05 0701 - 07/06 0700 In: 2742.1 [P.O.:360; I.V.:2352.1; NG/GT:30] Out: 280 [Urine:250; Emesis/NG output:30] Intake/Output this shift:    General appearance: alert, cooperative and no distress Resp: clear to auscultation bilaterally Cardio: regular rate and rhythm, S1, S2 normal, no murmur, click, rub or gallop GI: Soft with distention around the ileostomy. The ileostomy has minimal drainage present. No rigidity noted.  Lab Results:   Recent Labs  06/02/13 1304 06/03/13 0554  WBC 9.5 11.2*  HGB 12.1 12.6  HCT 34.4* 35.1*  PLT 235 274   BMET  Recent Labs  06/02/13 1304 06/03/13 0554  NA 132* 132*  K 3.3* 3.6  CL 99 99  CO2 26 24  GLUCOSE 122* 148*  BUN 7 6  CREATININE 0.49* 0.54  CALCIUM 8.5 8.7   PT/INR No results found for this basename: LABPROT, INR,  in the last 72 hours  Studies/Results: Ct Abdomen Pelvis W Contrast  06/02/2013   *RADIOLOGY REPORT*  Clinical Data: Abdominal pain, nausea and vomiting with history of Crohn disease  CT ABDOMEN AND PELVIS WITH CONTRAST  Technique:  Multidetector CT imaging of the abdomen and pelvis was performed following the standard protocol during bolus administration of intravenous contrast.  Contrast: 79m OMNIPAQUE IOHEXOL 300 MG/ML  SOLN, 1068mOMNIPAQUE IOHEXOL 300 MG/ML  SOLN  Comparison: Acute abdominal radiographs earlier today at 12:17 p.m.  Findings:  Lower Chest:  Mild dependent atelectasis bilaterally.  The lung  bases are otherwise clear.  The visualized cardiac structures within normal limits for size.  No pericardial effusion.  Small hiatal hernia.  Abdomen: Unremarkable CT appearance of the stomach, duodenum, spleen, adrenal glands and pancreas.  Normal hepatic morphology and contours.  No focal hepatic lesion.  Nonspecific low attenuation small volume perihepatic ascites. Gallbladder is unremarkable. No intra or extrahepatic biliary ductal dilatation.  Symmetric renal parenchymal enhancement bilaterally.  There are several low attenuation sub centimeter renal lesions bilaterally which are too small to characterize but statistically highly likely benign cysts.  No hydronephrosis or nephrolithiasis. Numerous loops of dilated small bowel containing air fluid levels consistent with small bowel obstruction.  No definite contrast material past the obstruction point.  The transition is in the right hemiabdomen underlying the right ileostomy and parastomal hernia at the superior margin of the hernia defect. The transition point is marked with arrows on the axial, coronal and sagittal reformats.  Parastomal hernia contains a loop of enlarged and dilated small bowel, however there is no obstruction of this specific loop of bowel.  There is associated interloop fluid within the parastomal hernia and within the abdomen.  The more distal small bowel is decompressed.  The residual Hartmann's pouch is atrophic.  Surgical changes of sub total colectomy.  Pelvis: Unremarkable bladder, uterus and adnexa.  No significant free fluid in the pelvis.  No suspicious adenopathy.  Bones: No acute fracture or aggressive appearing lytic or blastic osseous lesion. Lower lumbar degenerative disc disease and facet arthropathy.  Vascular:  Mild atherosclerotic vascular disease without aneurysmal dilatation.  No focal venous abnormality identified.  IMPRESSION:  1.  High-grade small bowel obstruction with the transition point (marked with arrows) the  underlying the superior margin of the right lower quadrant end ileostomy and associated parastomal hernia.  The etiology of the obstruction is most likely underlying adhesive disease.  Given the adjacent parastomal hernia, an internal hernia is a less likely possibility.  2. Surgical changes of sub total colectomy with right lower quadrant end ileostomy complicated by a large peristomal hernia containing a loop of the obstructed small bowel. Although this loop of bowel is obstructed from the more distal obstruction, it is does not appear incarcerated or obstructed in and of itself.  3.  Interloop and small perihepatic ascites.  4.  Additional ancillary findings as above.   Original Report Authenticated By: Jacqulynn Cadet, M.D.   Dg Abd Acute W/chest  06/02/2013   *RADIOLOGY REPORT*  Clinical Data: Abdominal pain.  Nausea  ACUTE ABDOMEN SERIES (ABDOMEN 2 VIEW & CHEST 1 VIEW)  Comparison: None.  Findings: Normal heart size.  There is no pleural effusion or edema.  No airspace consolidation identified.  There are multiple dilated loops of small bowel which measure up to 4.6 cm.  No fluid levels identified.  There is no free intraperitoneal air.  IMPRESSION:  1.  Abnormal small bowel dilatation.  Cannot rule out obstruction.   Original Report Authenticated By: Kerby Moors, M.D.    Anti-infectives: Anti-infectives   None      Assessment/Plan: Impression: Small bowel obstruction with parastoma hernia present. No significant improvement noted. Plan: If patient doesn't improve over the next 24 hours, we'll proceed with exploratory laparotomy, lysis of adhesions, possible parastomal hernia repair. Risks and benefits of the procedure including bleeding, infection, and recurrence of the hernia were fully explained to the patient, who gave informed consent.  LOS: 1 day    Burris Matherne A 06/03/2013

## 2013-06-03 NOTE — Progress Notes (Signed)
Pt c/o pain at this time.  Pt's BP elevated at 180/60's.  Morphine 45m IV given per orders.  Dr. JArnoldo Moralealso paged and made aware.  Awaiting return page.

## 2013-06-04 ENCOUNTER — Inpatient Hospital Stay (HOSPITAL_COMMUNITY): Payer: Medicare Other | Admitting: Anesthesiology

## 2013-06-04 ENCOUNTER — Encounter (HOSPITAL_COMMUNITY): Payer: Self-pay | Admitting: *Deleted

## 2013-06-04 ENCOUNTER — Encounter (HOSPITAL_COMMUNITY): Payer: Self-pay | Admitting: Anesthesiology

## 2013-06-04 ENCOUNTER — Encounter (HOSPITAL_COMMUNITY)
Admission: EM | Disposition: A | Discharge status: Home / Self Care | Payer: Self-pay | Source: Home / Self Care | Attending: General Surgery

## 2013-06-04 HISTORY — PX: LAPAROTOMY: SHX154

## 2013-06-04 HISTORY — PX: LYSIS OF ADHESION: SHX5961

## 2013-06-04 LAB — TYPE AND SCREEN

## 2013-06-04 LAB — BASIC METABOLIC PANEL
CO2: 24 mEq/L (ref 19–32)
Chloride: 98 mEq/L (ref 96–112)
Potassium: 3.9 mEq/L (ref 3.5–5.1)
Sodium: 131 mEq/L — ABNORMAL LOW (ref 135–145)

## 2013-06-04 LAB — CBC WITH DIFFERENTIAL/PLATELET
Eosinophils Relative: 0 % (ref 0–5)
HCT: 37.3 % (ref 36.0–46.0)
Hemoglobin: 13.4 g/dL (ref 12.0–15.0)
Lymphocytes Relative: 6 % — ABNORMAL LOW (ref 12–46)
Lymphs Abs: 0.7 10*3/uL (ref 0.7–4.0)
MCV: 87.8 fL (ref 78.0–100.0)
Monocytes Absolute: 0.7 10*3/uL (ref 0.1–1.0)
Neutro Abs: 10.9 10*3/uL — ABNORMAL HIGH (ref 1.7–7.7)
RBC: 4.25 MIL/uL (ref 3.87–5.11)
WBC: 12.2 10*3/uL — ABNORMAL HIGH (ref 4.0–10.5)

## 2013-06-04 SURGERY — LAPAROTOMY, EXPLORATORY
Anesthesia: General | Site: Abdomen | Wound class: Clean

## 2013-06-04 MED ORDER — MIDAZOLAM HCL 2 MG/2ML IJ SOLN
1.0000 mg | INTRAMUSCULAR | Status: DC | PRN
  Administered 2013-06-04: 2 mg via INTRAVENOUS

## 2013-06-04 MED ORDER — DEXAMETHASONE SODIUM PHOSPHATE 4 MG/ML IJ SOLN
4.0000 mg | Freq: Once | INTRAMUSCULAR | Status: AC
  Administered 2013-06-04: 4 mg via INTRAVENOUS

## 2013-06-04 MED ORDER — CLINDAMYCIN PHOSPHATE 900 MG/50ML IV SOLN: 900.0000 mg | Freq: Once | INTRAVENOUS | Status: DC

## 2013-06-04 MED ORDER — CLINDAMYCIN PHOSPHATE 900 MG/50ML IV SOLN
INTRAVENOUS | Status: AC
  Filled 2013-06-04: qty 50

## 2013-06-04 MED ORDER — GLYCOPYRROLATE 0.2 MG/ML IJ SOLN
INTRAMUSCULAR | Status: AC
  Filled 2013-06-04: qty 2

## 2013-06-04 MED ORDER — ENOXAPARIN SODIUM 40 MG/0.4ML ~~LOC~~ SOLN
40.0000 mg | SUBCUTANEOUS | Status: DC
  Administered 2013-06-05 – 2013-06-09 (×5): 40 mg via SUBCUTANEOUS
  Filled 2013-06-04 (×5): qty 0.4

## 2013-06-04 MED ORDER — FENTANYL CITRATE 0.05 MG/ML IJ SOLN
25.0000 ug | INTRAMUSCULAR | Status: DC | PRN
  Administered 2013-06-04: 50 ug via INTRAVENOUS

## 2013-06-04 MED ORDER — POVIDONE-IODINE 10 % EX OINT
TOPICAL_OINTMENT | CUTANEOUS | Status: DC | PRN
  Administered 2013-06-04: 2 via TOPICAL

## 2013-06-04 MED ORDER — NEOSTIGMINE METHYLSULFATE 1 MG/ML IJ SOLN
INTRAMUSCULAR | Status: DC | PRN
  Administered 2013-06-04: 3 mg via INTRAVENOUS

## 2013-06-04 MED ORDER — PROPOFOL 10 MG/ML IV EMUL
INTRAVENOUS | Status: AC
  Filled 2013-06-04: qty 20

## 2013-06-04 MED ORDER — ROCURONIUM BROMIDE 50 MG/5ML IV SOLN
INTRAVENOUS | Status: AC
  Filled 2013-06-04: qty 1

## 2013-06-04 MED ORDER — ONDANSETRON HCL 4 MG/2ML IJ SOLN
INTRAMUSCULAR | Status: AC
  Filled 2013-06-04: qty 2

## 2013-06-04 MED ORDER — LACTATED RINGERS IV SOLN
INTRAVENOUS | Status: DC
  Administered 2013-06-04 – 2013-06-05 (×3): via INTRAVENOUS

## 2013-06-04 MED ORDER — LIDOCAINE HCL 1 % IJ SOLN
INTRAMUSCULAR | Status: DC | PRN
  Administered 2013-06-04: 50 mg via INTRADERMAL

## 2013-06-04 MED ORDER — GLYCOPYRROLATE 0.2 MG/ML IJ SOLN
INTRAMUSCULAR | Status: DC | PRN
  Administered 2013-06-04: 0.4 mg via INTRAVENOUS

## 2013-06-04 MED ORDER — DEXAMETHASONE SODIUM PHOSPHATE 4 MG/ML IJ SOLN
INTRAMUSCULAR | Status: AC
  Filled 2013-06-04: qty 1

## 2013-06-04 MED ORDER — ROCURONIUM BROMIDE 100 MG/10ML IV SOLN
INTRAVENOUS | Status: DC | PRN
  Administered 2013-06-04: 10 mg via INTRAVENOUS
  Administered 2013-06-04: 30 mg via INTRAVENOUS
  Administered 2013-06-04: 10 mg via INTRAVENOUS

## 2013-06-04 MED ORDER — POVIDONE-IODINE 10 % EX OINT
TOPICAL_OINTMENT | CUTANEOUS | Status: AC
  Filled 2013-06-04: qty 2

## 2013-06-04 MED ORDER — LACTATED RINGERS IV SOLN
INTRAVENOUS | Status: DC
  Administered 2013-06-04 (×3): via INTRAVENOUS

## 2013-06-04 MED ORDER — CLINDAMYCIN PHOSPHATE 900 MG/50ML IV SOLN
INTRAVENOUS | Status: DC | PRN
  Administered 2013-06-04: 900 mg via INTRAVENOUS

## 2013-06-04 MED ORDER — FENTANYL CITRATE 0.05 MG/ML IJ SOLN
INTRAMUSCULAR | Status: AC
  Filled 2013-06-04: qty 5

## 2013-06-04 MED ORDER — FENTANYL CITRATE 0.05 MG/ML IJ SOLN
INTRAMUSCULAR | Status: DC | PRN
  Administered 2013-06-04: 50 ug via INTRAVENOUS
  Administered 2013-06-04 (×4): 25 ug via INTRAVENOUS

## 2013-06-04 MED ORDER — LIDOCAINE HCL (PF) 1 % IJ SOLN
INTRAMUSCULAR | Status: AC
  Filled 2013-06-04: qty 5

## 2013-06-04 MED ORDER — KETOROLAC TROMETHAMINE 30 MG/ML IJ SOLN: 30.0000 mg | Freq: Once | INTRAMUSCULAR | Status: DC

## 2013-06-04 MED ORDER — SUCCINYLCHOLINE CHLORIDE 20 MG/ML IJ SOLN
INTRAMUSCULAR | Status: DC | PRN
  Administered 2013-06-04: 125 mg via INTRAVENOUS

## 2013-06-04 MED ORDER — MIDAZOLAM HCL 2 MG/2ML IJ SOLN
INTRAMUSCULAR | Status: AC
  Filled 2013-06-04: qty 2

## 2013-06-04 MED ORDER — FENTANYL CITRATE 0.05 MG/ML IJ SOLN
INTRAMUSCULAR | Status: AC
  Filled 2013-06-04: qty 2

## 2013-06-04 MED ORDER — ONDANSETRON HCL 4 MG/2ML IJ SOLN: 4.0000 mg | Freq: Once | INTRAMUSCULAR | Status: DC | PRN

## 2013-06-04 MED ORDER — PROPOFOL 10 MG/ML IV BOLUS
INTRAVENOUS | Status: DC | PRN
  Administered 2013-06-04: 130 mg via INTRAVENOUS

## 2013-06-04 MED ORDER — SUCCINYLCHOLINE CHLORIDE 20 MG/ML IJ SOLN
INTRAMUSCULAR | Status: AC
  Filled 2013-06-04: qty 1

## 2013-06-04 MED ORDER — SODIUM CHLORIDE 0.9 % IR SOLN
Status: DC | PRN
  Administered 2013-06-04: 2000 mL

## 2013-06-04 MED ORDER — ONDANSETRON HCL 4 MG/2ML IJ SOLN
4.0000 mg | Freq: Once | INTRAMUSCULAR | Status: AC
  Administered 2013-06-04: 4 mg via INTRAVENOUS

## 2013-06-04 MED ORDER — MIDAZOLAM HCL 5 MG/5ML IJ SOLN
INTRAMUSCULAR | Status: DC | PRN
  Administered 2013-06-04: 2 mg via INTRAVENOUS

## 2013-06-04 SURGICAL SUPPLY — 39 items
APL SKNCLS STERI-STRIP NONHPOA (GAUZE/BANDAGES/DRESSINGS) ×1
BAG HAMPER (MISCELLANEOUS) ×2 IMPLANT
BARRIER SKIN 2 1/4 (WOUND CARE) ×2 IMPLANT
BARRIER SKIN OD1.75 2 1/4 FLNG (WOUND CARE) IMPLANT
BENZOIN TINCTURE PRP APPL 2/3 (GAUZE/BANDAGES/DRESSINGS) ×1 IMPLANT
BRR SKN FLT 1.75X2.25 2 PC (WOUND CARE) ×1
CLOTH BEACON ORANGE TIMEOUT ST (SAFETY) ×2 IMPLANT
COVER LIGHT HANDLE STERIS (MISCELLANEOUS) ×4 IMPLANT
DRAPE WARM FLUID 44X44 (DRAPE) ×2 IMPLANT
DURAPREP 26ML APPLICATOR (WOUND CARE) ×2 IMPLANT
ELECT REM PT RETURN 9FT ADLT (ELECTROSURGICAL) ×2
ELECTRODE REM PT RTRN 9FT ADLT (ELECTROSURGICAL) ×1 IMPLANT
GLOVE BIO SURGEON STRL SZ7.5 (GLOVE) ×4 IMPLANT
GLOVE BIOGEL PI IND STRL 7.0 (GLOVE) IMPLANT
GLOVE BIOGEL PI IND STRL 7.5 (GLOVE) IMPLANT
GLOVE BIOGEL PI INDICATOR 7.0 (GLOVE) ×2
GLOVE BIOGEL PI INDICATOR 7.5 (GLOVE) ×3
GLOVE ECLIPSE 7.0 STRL STRAW (GLOVE) ×2 IMPLANT
GLOVE SS BIOGEL STRL SZ 6.5 (GLOVE) IMPLANT
GLOVE SUPERSENSE BIOGEL SZ 6.5 (GLOVE) ×1
GOWN STRL REIN XL XLG (GOWN DISPOSABLE) ×6 IMPLANT
INST SET MAJOR GENERAL (KITS) ×2 IMPLANT
KIT ROOM TURNOVER APOR (KITS) ×2 IMPLANT
MANIFOLD NEPTUNE II (INSTRUMENTS) ×2 IMPLANT
NS IRRIG 1000ML POUR BTL (IV SOLUTION) ×4 IMPLANT
PACK ABDOMINAL MAJOR (CUSTOM PROCEDURE TRAY) ×2 IMPLANT
PAD ARMBOARD 7.5X6 YLW CONV (MISCELLANEOUS) ×2 IMPLANT
POUCH OSTOMY 2 PC DRNBL 2.25 (WOUND CARE) IMPLANT
POUCH OSTOMY DRNBL 2 1/4 (WOUND CARE) ×2
SET BASIN LINEN APH (SET/KITS/TRAYS/PACK) ×2 IMPLANT
SPONGE GAUZE 4X4 12PLY (GAUZE/BANDAGES/DRESSINGS) ×2 IMPLANT
SPONGE LAP 18X18 X RAY DECT (DISPOSABLE) ×2 IMPLANT
STAPLER VISISTAT (STAPLE) ×2 IMPLANT
SUCTION POOLE TIP (SUCTIONS) ×2 IMPLANT
SUT ETHIBOND 0 MO6 C/R (SUTURE) ×1 IMPLANT
SUT NOVA NAB GS-26 0 60 (SUTURE) ×4 IMPLANT
SUT SILK 3 0 SH CR/8 (SUTURE) ×1 IMPLANT
TAPE PAPER 2X10 WHT MICROPORE (GAUZE/BANDAGES/DRESSINGS) ×1 IMPLANT
TRAY FOLEY CATH 14FR (SET/KITS/TRAYS/PACK) ×2 IMPLANT

## 2013-06-04 NOTE — Anesthesia Postprocedure Evaluation (Signed)
  Anesthesia Post-op Note  Patient: Nicole Cochran  Procedure(s) Performed: Procedure(s) with comments: EXPLORATORY LAPAROTOMY (N/A) -  parastomal hernia repair LYSIS OF ADHESION (N/A)  Patient Location: PACU  Anesthesia Type:General  Level of Consciousness: awake, alert , oriented and patient cooperative  Airway and Oxygen Therapy: Patient Spontanous Breathing and Patient connected to face mask oxygen  Post-op Pain: 3 /10, mild  Post-op Assessment: Post-op Vital signs reviewed, Patient's Cardiovascular Status Stable, Respiratory Function Stable, Patent Airway and Pain level controlled  Post-op Vital Signs: Reviewed and stable  Complications: No apparent anesthesia complications

## 2013-06-04 NOTE — Op Note (Signed)
Patient:  Nicole Cochran  DOB:  May 12, 1935  MRN:  [id number]   Preop Diagnosis:  Small bowel obstruction, parastomal hernia  Postop Diagnosis:  Same, adhesive disease  Procedure:  Exploratory laparotomy, lysis of adhesions, parastomal hernia repair  Surgeon:  Aviva Signs, M.D.  Anes:  General endotracheal  Indications:  Patient is a 78 year old white female presents with a small bowel obstruction secondary to adhesive disease. She is status post a total colectomy in the past with ileostomy formation. CT scan the abdomen reveals a high grade small bowel obstruction. In addition, she does have a peristomal hernia. The risks and benefits of the procedure including bleeding, infection, cardiopulmonary difficulties, and a possibly recurrence of the parastomal hernia were fully explained to the patient, who gave informed consent.  Procedure note:  The patient was placed the supine position. After induction of general tracheal anesthesia, the ileostomy was isolated using an iodoform impregnated drape. The abdomen was then prepped with DuraPrep. Surgical site confirmation was performed.  A midline incision was made from above the umbilicus to the suprapubic region to a previous surgical scar. The peritoneal cavity was entered into with minimal difficulty. Multiple thin adhesive bands were noted around the small bowel. From the proximal dilated small bowel, the bowel was followed down to the right lower quadrant. There was a partially obstructing stricture of adhesive band just inferior to the ileostomy. In addition, multiple small bowel loops were noted superiorly in the parastomal hernia. These were all reduced. I continued to lyse these adhesions distally to the ileostomy. Close to where the stricture or was, the bowel wall appeared weak and just distal to it. This weakness was reinforced using 3-0 silk sutures. No spillage of enteric contents were ever noted. It was elected to proceed with a primary  repair of the parastomal hernia. This was performed along the superior aspect of the ileostomy using 0 Ethibond interrupted sutures. The bowel was then returned into the abdominal cavity an orderly fashion as it was inspected from the ligament of Treitz to the ileostomy. The fascia was reapproximated using a looped oh Novafil running suture. The subcutaneous layer was irrigated normal saline and the skin was closed using staples. Betadine ointment and a dry sterile dressing were applied. An ileostomy bag was then applied.  All tape and needle counts were correct at the end of the procedure. Patient was extubated in the operating room and transferred past to PACU in stable condition.  Complications:  None  EBL:  50 cc  Specimen:  None

## 2013-06-04 NOTE — Transfer of Care (Signed)
Immediate Anesthesia Transfer of Care Note  Patient: Nicole Cochran  Procedure(s) Performed: Procedure(s) with comments: EXPLORATORY LAPAROTOMY (N/A) -  parastomal hernia repair LYSIS OF ADHESION (N/A)  Patient Location: PACU  Anesthesia Type:General  Level of Consciousness: awake and patient cooperative  Airway & Oxygen Therapy: Patient Spontanous Breathing and Patient connected to face mask oxygen  Post-op Assessment: Report given to PACU RN, Post -op Vital signs reviewed and stable and Patient moving all extremities  Post vital signs: Reviewed and stable  Complications: No apparent anesthesia complications

## 2013-06-04 NOTE — Anesthesia Procedure Notes (Signed)
Procedure Name: Intubation Date/Time: 06/04/2013 11:48 AM Performed by: Charmaine Downs Pre-anesthesia Checklist: Emergency Drugs available, Suction available, Patient identified and Patient being monitored Patient Re-evaluated:Patient Re-evaluated prior to inductionOxygen Delivery Method: Circle system utilized Preoxygenation: Pre-oxygenation with 100% oxygen Intubation Type: IV induction, Cricoid Pressure applied and Rapid sequence Ventilation: Mask ventilation without difficulty Laryngoscope Size: 3 and Mac Grade View: Grade I Tube type: Oral Tube size: 7.0 mm Number of attempts: 1 Airway Equipment and Method: Stylet Placement Confirmation: ETT inserted through vocal cords under direct vision,  positive ETCO2 and breath sounds checked- equal and bilateral Secured at: 22 cm Tube secured with: Tape Dental Injury: Teeth and Oropharynx as per pre-operative assessment

## 2013-06-04 NOTE — Progress Notes (Signed)
Utilization Review Complete  

## 2013-06-04 NOTE — Anesthesia Preprocedure Evaluation (Signed)
Anesthesia Evaluation  Patient identified by MRN, date of birth, ID band Patient awake    Reviewed: Allergy & Precautions, H&P , NPO status , Patient's Chart, lab work & pertinent test results  History of Anesthesia Complications Negative for: history of anesthetic complications  Airway Mallampati: II TM Distance: >3 FB Neck ROM: Full    Dental  (+) Edentulous Upper and Edentulous Lower   Pulmonary pneumonia -, resolved,    Pulmonary exam normal       Cardiovascular hypertension, Pt. on medications Rhythm:Regular Rate:Normal     Neuro/Psych    GI/Hepatic Hx Crohn's dx, no steroids recently   Endo/Other    Renal/GU      Musculoskeletal   Abdominal   Peds  Hematology   Anesthesia Other Findings SBO on NG suction.  Reproductive/Obstetrics                           Anesthesia Physical Anesthesia Plan  ASA: III  Anesthesia Plan: General   Post-op Pain Management:    Induction: Intravenous, Rapid sequence and Cricoid pressure planned  Airway Management Planned:   Additional Equipment:   Intra-op Plan:   Post-operative Plan: Extubation in OR  Informed Consent: I have reviewed the patients History and Physical, chart, labs and discussed the procedure including the risks, benefits and alternatives for the proposed anesthesia with the patient or authorized representative who has indicated his/her understanding and acceptance.     Plan Discussed with:   Anesthesia Plan Comments:         Anesthesia Quick Evaluation

## 2013-06-05 LAB — CBC
HCT: 36 % (ref 36.0–46.0)
Hemoglobin: 12.8 g/dL (ref 12.0–15.0)
MCV: 87 fL (ref 78.0–100.0)
RBC: 4.14 MIL/uL (ref 3.87–5.11)
WBC: 9.2 10*3/uL (ref 4.0–10.5)

## 2013-06-05 LAB — BASIC METABOLIC PANEL
BUN: 14 mg/dL (ref 6–23)
Chloride: 100 mEq/L (ref 96–112)
Glucose, Bld: 121 mg/dL — ABNORMAL HIGH (ref 70–99)
Potassium: 3.6 mEq/L (ref 3.5–5.1)

## 2013-06-05 MED ORDER — KCL IN DEXTROSE-NACL 40-5-0.9 MEQ/L-%-% IV SOLN
INTRAVENOUS | Status: DC
  Administered 2013-06-05 – 2013-06-09 (×7): via INTRAVENOUS
  Filled 2013-06-05 (×12): qty 1000

## 2013-06-05 NOTE — Progress Notes (Signed)
1 Day Post-Op  Subjective: Mild incisional pain, well controlled with pain medication. Does feel movement in her stomach.  Objective: Vital signs in last 24 hours: Temp:  [97.3 F (36.3 C)-99.7 F (37.6 C)] 97.3 F (36.3 C) (07/08 0153) Pulse Rate:  [67-76] 75 (07/08 0153) Resp:  [19-26] 20 (07/08 0153) BP: (127-171)/(62-77) 127/62 mmHg (07/08 0153) SpO2:  [91 %-95 %] 92 % (07/08 0153) Weight:  [75.297 kg (166 lb)] 75.297 kg (166 lb) (07/07 1008) Last BM Date: 06/01/13  Intake/Output from previous day: 07/07 0701 - 07/08 0700 In: 3095 [P.O.:120; I.V.:2855; NG/GT:120] Out: 1125 [Urine:500; Emesis/NG output:575; Blood:50] Intake/Output this shift:    General appearance: alert, cooperative and no distress Resp: clear to auscultation bilaterally Cardio: regular rate and rhythm, S1, S2 normal, no murmur, click, rub or gallop GI: Soft. Dressing dry and intact. Ileostomy with some contrast fluid in the bag. Occasional bowel sounds appreciated.  Lab Results:   Recent Labs  06/04/13 0536 06/05/13 0527  WBC 12.2* 9.2  HGB 13.4 12.8  HCT 37.3 36.0  PLT 273 254   BMET  Recent Labs  06/04/13 0536 06/05/13 0527  NA 131* 132*  K 3.9 3.6  CL 98 100  CO2 24 25  GLUCOSE 144* 121*  BUN 6 14  CREATININE 0.55 0.67  CALCIUM 8.8 8.5   PT/INR No results found for this basename: LABPROT, INR,  in the last 72 hours  Studies/Results: No results found.  Anti-infectives: Anti-infectives   Start     Dose/Rate Route Frequency Ordered Stop   06/04/13 1030  clindamycin (CLEOCIN) IVPB 900 mg  Status:  Discontinued     900 mg 100 mL/hr over 30 Minutes Intravenous  Once 06/04/13 1016 06/04/13 1444      Assessment/Plan: s/p Procedure(s): EXPLORATORY LAPAROTOMY LYSIS OF ADHESION Impression: Stable on postoperative day one. Will adjust IV fluids. Continue current management. We'll leave NG tube in for another 24 hours.  LOS: 3 days    Kolbee Stallman A 06/05/2013

## 2013-06-05 NOTE — Anesthesia Postprocedure Evaluation (Signed)
  Anesthesia Post-op Note  Patient: Nicole Cochran  Procedure(s) Performed: Procedure(s) with comments: EXPLORATORY LAPAROTOMY (N/A) -  parastomal hernia repair LYSIS OF ADHESION (N/A)  Patient Location: room 325  Anesthesia Type:General  Level of Consciousness: awake, alert , oriented and patient cooperative  Airway and Oxygen Therapy: Patient Spontanous Breathing and Patient connected to nasal cannula oxygen  Post-op Pain: 3 /10, mild  Post-op Assessment: Post-op Vital signs reviewed, Patient's Cardiovascular Status Stable, Respiratory Function Stable, Patent Airway, NAUSEA AND VOMITING PRESENT and Pain level controlled  Post-op Vital Signs: Reviewed and stable  Complications: No apparent anesthesia complications

## 2013-06-06 ENCOUNTER — Encounter (HOSPITAL_COMMUNITY): Payer: Self-pay | Admitting: General Surgery

## 2013-06-06 LAB — BASIC METABOLIC PANEL
BUN: 16 mg/dL (ref 6–23)
CO2: 23 mEq/L (ref 19–32)
Chloride: 105 mEq/L (ref 96–112)
Creatinine, Ser: 0.6 mg/dL (ref 0.50–1.10)
Glucose, Bld: 130 mg/dL — ABNORMAL HIGH (ref 70–99)

## 2013-06-06 LAB — PHOSPHORUS: Phosphorus: 1.5 mg/dL — ABNORMAL LOW (ref 2.3–4.6)

## 2013-06-06 LAB — CBC
HCT: 34.7 % — ABNORMAL LOW (ref 36.0–46.0)
MCV: 88.5 fL (ref 78.0–100.0)
RDW: 13.1 % (ref 11.5–15.5)
WBC: 5.6 10*3/uL (ref 4.0–10.5)

## 2013-06-06 MED ORDER — AMLODIPINE BESYLATE 5 MG PO TABS: 5.0000 mg | ORAL_TABLET | Freq: Every day | ORAL | Status: DC

## 2013-06-06 MED ORDER — AMLODIPINE BESYLATE 5 MG PO TABS
10.0000 mg | ORAL_TABLET | Freq: Every day | ORAL | Status: DC
  Administered 2013-06-06 – 2013-06-08 (×3): 10 mg via ORAL
  Filled 2013-06-06 (×3): qty 2

## 2013-06-06 MED ORDER — POTASSIUM PHOSPHATE DIBASIC 3 MMOLE/ML IV SOLN
30.0000 mmol | Freq: Once | INTRAVENOUS | Status: AC
  Administered 2013-06-06: 30 mmol via INTRAVENOUS
  Filled 2013-06-06: qty 10

## 2013-06-06 NOTE — Progress Notes (Signed)
2 Days Post-Op  Subjective: Feeling much better. Minimal incisional pain. Ileostomy bag has had to be changed several times.  Objective: Vital signs in last 24 hours: Temp:  [97.4 F (36.3 C)-99 F (37.2 C)] 99 F (37.2 C) (07/09 0432) Pulse Rate:  [71-74] 71 (07/09 0432) Resp:  [20] 20 (07/09 0432) BP: (143-156)/(59-61) 143/59 mmHg (07/09 0432) SpO2:  [93 %-96 %] 94 % (07/09 0432) Last BM Date: 06/01/13  Intake/Output from previous day: 07/08 0701 - 07/09 0700 In: 2503.8 [I.V.:2503.8] Out: 1300 [Urine:650; Emesis/NG output:650] Intake/Output this shift:    General appearance: alert, cooperative and no distress Resp: clear to auscultation bilaterally Cardio: regular rate and rhythm, S1, S2 normal, no murmur, click, rub or gallop GI: Soft. Active bowel sounds appreciated. Incision healing well. Ileostomy patent.  Lab Results:   Recent Labs  06/05/13 0527 06/06/13 0537  WBC 9.2 5.6  HGB 12.8 12.3  HCT 36.0 34.7*  PLT 254 255   BMET  Recent Labs  06/05/13 0527 06/06/13 0537  NA 132* 136  K 3.6 4.3  CL 100 105  CO2 25 23  GLUCOSE 121* 130*  BUN 14 16  CREATININE 0.67 0.60  CALCIUM 8.5 8.6   PT/INR No results found for this basename: LABPROT, INR,  in the last 72 hours  Studies/Results: No results found.  Anti-infectives: Anti-infectives   Start     Dose/Rate Route Frequency Ordered Stop   06/04/13 1030  clindamycin (CLEOCIN) IVPB 900 mg  Status:  Discontinued     900 mg 100 mL/hr over 30 Minutes Intravenous  Once 06/04/13 1016 06/04/13 1444      Assessment/Plan: s/p Procedure(s): EXPLORATORY LAPAROTOMY LYSIS OF ADHESION Impression: Bowel function returning, postoperative day 2. Hypophosphatemia. Plan: Will supplement phosphorus. We'll remove Foley and gastric tube. Advance to full liquid diet.  LOS: 4 days    Andrew Blasius A 06/06/2013

## 2013-06-06 NOTE — Plan of Care (Signed)
Problem: Phase III Progression Outcomes Goal: Activity at appropriate level-compared to baseline (UP IN CHAIR FOR HEMODIALYSIS)  Outcome: Progressing Patient ambulated in hallway without difficulty x's 1 assist.

## 2013-06-06 NOTE — Progress Notes (Signed)
MEDICATION RELATED CONSULT NOTE - INITIAL   Pharmacy Consult for Phosphorous replacement Indication: hypophosphatemia  Allergies  Allergen Reactions  . Penicillins     rash  . Tape     Patient Measurements: Height: 5' 2"  (157.5 cm) Weight: 166 lb (75.297 kg) IBW/kg (Calculated) : 50.1  Vital Signs: Temp: 99 F (37.2 C) (07/09 0432) Temp src: Oral (07/09 0432) BP: 143/59 mmHg (07/09 0432) Pulse Rate: 71 (07/09 0432) Intake/Output from previous day: 07/08 0701 - 07/09 0700 In: 2503.8 [I.V.:2503.8] Out: 1300 [Urine:650; Emesis/NG output:650] Intake/Output from this shift:    Labs:  Recent Labs  06/04/13 0536 06/05/13 0527 06/06/13 0537  WBC 12.2* 9.2 5.6  HGB 13.4 12.8 12.3  HCT 37.3 36.0 34.7*  PLT 273 254 255  CREATININE 0.55 0.67 0.60  MG  --  1.5 1.9  PHOS  --  2.5 1.5*   Estimated Creatinine Clearance: 56.9 ml/min (by C-G formula based on Cr of 0.6).   Microbiology: Recent Results (from the past 720 hour(s))  URINE CULTURE     Status: None   Collection Time    06/02/13 12:23 PM      Result Value Range Status   Specimen Description URINE, CLEAN CATCH   Final   Special Requests NONE   Final   Culture  Setup Time 06/02/2013 19:30   Final   Colony Count 15,000 COLONIES/ML   Final   Culture     Final   Value: Multiple bacterial morphotypes present, none predominant. Suggest appropriate recollection if clinically indicated.   Report Status 06/03/2013 FINAL   Final  SURGICAL PCR SCREEN     Status: None   Collection Time    06/03/13  1:28 PM      Result Value Range Status   MRSA, PCR NEGATIVE  NEGATIVE Final   Staphylococcus aureus NEGATIVE  NEGATIVE Final   Comment:            The Xpert SA Assay (FDA     approved for NASAL specimens     in patients over 49 years of age),     is one component of     a comprehensive surveillance     program.  Test performance has     been validated by [employer] for patients greater     than or equal to 26  year old.     It is not intended     to diagnose infection nor to     guide or monitor treatment.    Medical History: Past Medical History  Diagnosis Date  . Crohn's   . Hypertension   . Arthritis   . Right knee pain   . Heart murmur   . Pneumonia     78 years old,  and 2000    Medications:  Scheduled:  . enoxaparin (LOVENOX) injection  40 mg Subcutaneous Q24H  . pantoprazole (PROTONIX) IV  40 mg Intravenous QHS  . potassium phosphate IVPB (mmol)  30 mmol Intravenous Once    Assessment: 78yo female with phosphorous = 1.5.  Estimated Creatinine Clearance: 56.9 ml/min (by C-G formula based on Cr of 0.6).  Goal of Therapy:  Replace phosphorous to WNL  Plan:  K-PHos 17mols IV today x 1 Re-check labs in am  HHart RobinsonsA 06/06/2013,8:49 AM

## 2013-06-06 NOTE — Care Management Note (Unsigned)
    Page 1 of 1   06/06/2013     3:01:58 PM   CARE MANAGEMENT NOTE 06/06/2013  Patient:  Nicole Cochran, Nicole Cochran   Account Number:  [id number]  Date Initiated:  06/06/2013  Documentation initiated by:  Claretha Cooper  Subjective/Objective Assessment:   Pt admitted from home where she lives with her son and g'son. 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 No HH needs anticipated.     Action/Plan:   Anticipated DC Date:  06/06/2013   Anticipated DC Plan:  [place]  CM consult      Choice offered to / List presented to:             Status of service:  In process, will continue to follow Medicare Important Message given?   (If response is "NO", the following Medicare IM given date fields will be blank) Date Medicare IM given:   Date Additional Medicare IM given:    Discharge Disposition:    Per UR Regulation:    If discussed at Long Length of Stay Meetings, dates discussed:    Comments:  06/06/13 Claretha Cooper RN BSN CM

## 2013-06-07 LAB — BASIC METABOLIC PANEL
Calcium: 8.7 mg/dL (ref 8.4–10.5)
Chloride: 105 mEq/L (ref 96–112)
Creatinine, Ser: 0.5 mg/dL (ref 0.50–1.10)
GFR calc Af Amer: >90 mL/min (ref >90)

## 2013-06-07 LAB — CBC
Hemoglobin: 12.1 g/dL (ref 12.0–15.0)
MCHC: 34.4 g/dL (ref 30.0–36.0)
Platelets: 318 10*3/uL (ref 150–400)
RDW: 13 % (ref 11.5–15.5)

## 2013-06-07 LAB — PHOSPHORUS: Phosphorus: 2.3 mg/dL (ref 2.3–4.6)

## 2013-06-07 NOTE — Plan of Care (Signed)
Problem: Phase II Progression Outcomes Goal: Progress activity as tolerated unless otherwise ordered Talked to pt about why diet was changed back to CL

## 2013-06-07 NOTE — Progress Notes (Signed)
3 Days Post-Op  Subjective: Had some episodes of nausea and vomiting. Her ostomy is still functioning.  Objective: Vital signs in last 24 hours: Temp:  [97.9 F (36.6 C)-98.2 F (36.8 C)] 98 F (36.7 C) (07/10 0517) Pulse Rate:  [68-74] 70 (07/10 0517) Resp:  [20] 20 (07/10 0517) BP: (168-177)/(64-75) 170/64 mmHg (07/10 0517) SpO2:  [93 %-98 %] 93 % (07/10 0517) Last BM Date: 06/06/13  Intake/Output from previous day: 07/09 0701 - 07/10 0700 In: 2393.3 [I.V.:1883.3; IV Piggyback:510] Out: 211 [Urine:300; Stool:175] Intake/Output this shift:    General appearance: cooperative and fatigued Resp: clear to auscultation bilaterally Cardio: regular rate and rhythm, S1, S2 normal, no murmur, click, rub or gallop GI: Soft. Colostomy patent. Bowel sounds appreciated.  Lab Results:   Recent Labs  06/06/13 0537 06/07/13 0631  WBC 5.6 7.9  HGB 12.3 12.1  HCT 34.7* 35.2*  PLT 255 318   BMET  Recent Labs  06/06/13 0537 06/07/13 0631  NA 136 134*  K 4.3 4.3  CL 105 105  CO2 23 21  GLUCOSE 130* 180*  BUN 16 12  CREATININE 0.60 0.50  CALCIUM 8.6 8.7   PT/INR No results found for this basename: LABPROT, INR,  in the last 72 hours  Studies/Results: No results found.  Anti-infectives: Anti-infectives   Start     Dose/Rate Route Frequency Ordered Stop   06/04/13 1030  clindamycin (CLEOCIN) IVPB 900 mg  Status:  Discontinued     900 mg 100 mL/hr over 30 Minutes Intravenous  Once 06/04/13 1016 06/04/13 1444      Assessment/Plan: s/p Procedure(s): EXPLORATORY LAPAROTOMY LYSIS OF ADHESION Impression: Postoperative day 3, status post lysis of adhesions and repair of peristomal hernia. I suspect her nausea secondary to dysfunctional GI tract. Have backed off to clear liquid diet. Hypophosphatemia has resolved. Will encourage ambulation and sitting in chair.  LOS: 5 days    Ara Mano A 06/07/2013

## 2013-06-07 NOTE — Progress Notes (Signed)
12m of Norvasc daily was ordered by Dr. JArnoldo Moralefor pts HTN. Pt is on 10 mg of Norvasc at home.

## 2013-06-07 NOTE — Progress Notes (Signed)
Pt has had 3 episodes of vomiting since 7p, antiemetic is effective for only a few hours. MD was notified of pts status and changed her diet from full liquids to clear liquids. Will continue to monitor.

## 2013-06-07 NOTE — Progress Notes (Signed)
[facility] for Phosphorous replacement Indication: hypophosphatemia  Allergies  Allergen Reactions  . Penicillins     rash  . Tape    Patient Measurements: Height: 5' 2"  (157.5 cm) Weight: 166 lb (75.297 kg) IBW/kg (Calculated) : 50.1  Vital Signs: Temp: 98 F (36.7 C) (07/10 0517) Temp src: Oral (07/10 0517) BP: 170/64 mmHg (07/10 0517) Pulse Rate: 70 (07/10 0517) Intake/Output from previous day: 07/09 0701 - 07/10 0700 In: 2393.3 [I.V.:1883.3; IV Piggyback:510] Out: 812 [Urine:300; Stool:175] Intake/Output from this shift:    Labs:  Recent Labs  06/05/13 0527 06/06/13 0537 06/07/13 0631  WBC 9.2 5.6 7.9  HGB 12.8 12.3 12.1  HCT 36.0 34.7* 35.2*  PLT 254 255 318  CREATININE 0.67 0.60 0.50  MG 1.5 1.9  --   PHOS 2.5 1.5* 2.3   Estimated Creatinine Clearance: 56.9 ml/min (by C-G formula based on Cr of 0.5).  Microbiology: Recent Results (from the past 720 hour(s))  URINE CULTURE     Status: None   Collection Time    06/02/13 12:23 PM      Result Value Range Status   Specimen Description URINE, CLEAN CATCH   Final   Special Requests NONE   Final   Culture  Setup Time 06/02/2013 19:30   Final   Colony Count 15,000 COLONIES/ML   Final   Culture     Final   Value: Multiple bacterial morphotypes present, none predominant. Suggest appropriate recollection if clinically indicated.   Report Status 06/03/2013 FINAL   Final  SURGICAL PCR SCREEN     Status: None   Collection Time    06/03/13  1:28 PM      Result Value Range Status   MRSA, PCR NEGATIVE  NEGATIVE Final   Staphylococcus aureus NEGATIVE  NEGATIVE Final   Comment:            The Xpert SA Assay (FDA     approved for NASAL specimens     in patients over 76 years of age),     is one component of     a comprehensive surveillance     program.  Test performance has     been validated by [employer] for patients greater     than or equal to 76 year old.    It is not intended     to diagnose infection nor to     guide or monitor treatment.   Medical History: Past Medical History  Diagnosis Date  . Crohn's   . Hypertension   . Arthritis   . Right knee pain   . Heart murmur   . Pneumonia     78 years old,  and 2002   Medications:  Scheduled:  . amLODipine  10 mg Oral QHS  . enoxaparin (LOVENOX) injection  40 mg Subcutaneous Q24H  . pantoprazole (PROTONIX) IV  40 mg Intravenous QHS   Assessment: PO4 is 2.3 today, WNL.    Estimated Creatinine Clearance: 56.9 ml/min (by C-G formula based on Cr of 0.5).  Goal of Therapy:  Replace phosphorous to WNL  Plan:  No additional K-Phos today F/U labs  Hart Robinsons A 06/07/2013,8:10 AM

## 2013-06-08 LAB — BASIC METABOLIC PANEL
GFR calc Af Amer: >90 mL/min (ref >90)
GFR calc non Af Amer: >90 mL/min (ref >90)
Potassium: 3.9 mEq/L (ref 3.5–5.1)
Sodium: 137 mEq/L (ref 135–145)

## 2013-06-08 LAB — CBC
Hemoglobin: 12.2 g/dL (ref 12.0–15.0)
RBC: 4 MIL/uL (ref 3.87–5.11)
WBC: 8.1 10*3/uL (ref 4.0–10.5)

## 2013-06-08 MED ORDER — PROMETHAZINE HCL 25 MG/ML IJ SOLN
12.5000 mg | Freq: Four times a day (QID) | INTRAMUSCULAR | Status: DC | PRN
  Administered 2013-06-08: 12.5 mg via INTRAVENOUS
  Filled 2013-06-08: qty 1

## 2013-06-08 MED ORDER — ALUM & MAG HYDROXIDE-SIMETH 200-200-20 MG/5ML PO SUSP
30.0000 mL | ORAL | Status: DC | PRN
  Administered 2013-06-09: 30 mL via ORAL
  Filled 2013-06-08: qty 30

## 2013-06-08 MED ORDER — IRBESARTAN 300 MG PO TABS
300.0000 mg | ORAL_TABLET | Freq: Every day | ORAL | Status: DC
  Administered 2013-06-08 – 2013-06-09 (×3): 300 mg via ORAL
  Filled 2013-06-08 (×3): qty 1

## 2013-06-08 MED ORDER — IRBESARTAN 300 MG PO TABS: 300.0000 mg | ORAL_TABLET | Freq: Every day | ORAL | Status: DC

## 2013-06-08 MED ORDER — PANTOPRAZOLE SODIUM 40 MG PO TBEC
40.0000 mg | DELAYED_RELEASE_TABLET | Freq: Every day | ORAL | Status: DC
  Administered 2013-06-08: 40 mg via ORAL
  Filled 2013-06-08: qty 1

## 2013-06-08 MED ORDER — HYDROCODONE-ACETAMINOPHEN 10-325 MG PO TABS
1.0000 | ORAL_TABLET | Freq: Four times a day (QID) | ORAL | Status: DC | PRN
  Administered 2013-06-08 (×2): 1 via ORAL
  Filled 2013-06-08: qty 1
  Filled 2013-06-08: qty 2

## 2013-06-08 NOTE — Progress Notes (Signed)
4 Days Post-Op  Subjective: Feels much better. No nausea or vomiting since yesterday evening.  Objective: Vital signs in last 24 hours: Temp:  [98.2 F (36.8 C)-98.7 F (37.1 C)] 98.2 F (36.8 C) (07/11 0444) Pulse Rate:  [69-74] 70 (07/11 0444) Resp:  [20] 20 (07/11 0444) BP: (158-188)/(63-84) 166/63 mmHg (07/11 0444) SpO2:  [92 %-97 %] 97 % (07/11 0444) Last BM Date: 06/07/13  Intake/Output from previous day: 07/10 0701 - 07/11 0700 In: 960 [P.O.:960] Out: 2650 [Urine:1375; Emesis/NG output:1100; Stool:175] Intake/Output this shift: Total I/O In: -  Out: 700 [Urine:200; Stool:500]  General appearance: alert, cooperative and no distress Resp: clear to auscultation bilaterally Cardio: regular rate and rhythm, S1, S2 normal, no murmur, click, rub or gallop GI: Soft. Ileostomy patent. Mild erythema and ecchymosis around surgical site. No purulent drainage noted.  Lab Results:   Recent Labs  06/07/13 0631 06/08/13 0539  WBC 7.9 8.1  HGB 12.1 12.2  HCT 35.2* 35.4*  PLT 318 342   BMET  Recent Labs  06/07/13 0631 06/08/13 0539  NA 134* 137  K 4.3 3.9  CL 105 110  CO2 21 18*  GLUCOSE 180* 135*  BUN 12 14  CREATININE 0.50 0.52  CALCIUM 8.7 8.9   PT/INR No results found for this basename: LABPROT, INR,  in the last 72 hours  Studies/Results: No results found.  Anti-infectives: Anti-infectives   Start     Dose/Rate Route Frequency Ordered Stop   06/04/13 1030  clindamycin (CLEOCIN) IVPB 900 mg  Status:  Discontinued     900 mg 100 mL/hr over 30 Minutes Intravenous  Once 06/04/13 1016 06/04/13 1444      Assessment/Plan: s/p Procedure(s): EXPLORATORY LAPAROTOMY LYSIS OF ADHESION Impression: Bowel function seems to have improved. We'll advance to soft diet. Will continue to watch wound, though it had to be revised as the incision was through a previous surgical scar. Patient overall looks much better.  LOS: 6 days    Skye Rodarte A 06/08/2013

## 2013-06-08 NOTE — Progress Notes (Deleted)
.  maj

## 2013-06-08 NOTE — Progress Notes (Signed)
Pt was continuing to have vomiting bouts and zofran was only helping for 2-3 hours, MD was called and 12.45m Phenergan was ordered. Nausea and vomiting subsided. Pt asleep and stable, will continue to monitor.   Pts BP was 188/75, MD was called, 3046mof Diovan was ordered. Diovan is unavailable and Avapra 30026ms equivalent to Diovan via pharmacy. Avapra 300m24ms ordered. Pt states she takes Avapra at home. Pts BP now 170/79. Pt stable, will continue to monitor.   Pts has been experiencing more pain in abdomen, pain medication was given and relieved most of pain. Around incision site is more red. MD notified of status, MD stated "we will watch it".

## 2013-06-08 NOTE — Plan of Care (Signed)
Problem: Phase I Progression Outcomes Goal: Initial discharge plan identified Talked with pt about possible DC tomorrow.

## 2013-06-09 LAB — BASIC METABOLIC PANEL
CO2: 17 mEq/L — ABNORMAL LOW (ref 19–32)
Glucose, Bld: 169 mg/dL — ABNORMAL HIGH (ref 70–99)
Potassium: 3.8 mEq/L (ref 3.5–5.1)
Sodium: 133 mEq/L — ABNORMAL LOW (ref 135–145)

## 2013-06-09 MED ORDER — HYDROCODONE-ACETAMINOPHEN 5-325 MG PO TABS: 1.0000 | ORAL_TABLET | ORAL | Status: DC | PRN

## 2013-06-09 NOTE — Progress Notes (Signed)
Pt a/o.vss. Up ad lib. Staples to abdomen clean dry and intact. Discharge instructions given. Prescriptions given. Pt verbalized understanding of instructions. Left floor via wheelchair with family member and nursing staff.

## 2013-06-09 NOTE — Progress Notes (Signed)
Pt was having a lot of gas and indigestion so I gave her a dose of maalox. It made her nauseous and she vomited three times. I gave her a dose of zofran, she states she "feels better".

## 2013-06-09 NOTE — Discharge Summary (Signed)
Physician Discharge Summary  Patient ID: Nicole Cochran MRN: [id number] DOB/AGE: 1934/12/17 78 y.o.  Admit date: 06/02/2013 Discharge date: 06/09/2013  Admission Diagnoses: Small bowel obstruction, parastomal hernia  Discharge Diagnoses: Same Active Problems:   * No active hospital problems. *   Discharged Condition: good  Hospital Course: Patient is a 78 year old white female who presented emergency room with worsening nausea and vomiting. A CT scan of the abdomen revealed a small bowel obstruction secondary to adhesive disease as well as a peristomal hernia. Initially, we tried nasogastric tube decompression and conservative therapy, but her bowel obstruction persisted. She ultimately went to the operating room on 06/04/2013 and underwent an exploratory laparotomy, lysis of adhesions, repair of parastomal hernia. She tolerated the surgery well. Her postoperative course was remarkable for moderate bowel dysfunction which subsequently resolved. Her diet was advanced without difficulty once her bowel function returned. Ileostomy is patent.  She is being discharged home in good improving condition.  Treatments: surgery: Exploratory laparotomy, lysis of adhesions, parastomal hernia repair on 06/04/2013  Discharge Exam: Blood pressure 176/70, pulse 71, temperature 98 F (36.7 C), temperature source Oral, resp. rate 18, height 5' 2"  (1.575 m), weight 75.297 kg (166 lb), SpO2 97.00%. General appearance: alert, cooperative and no distress Resp: clear to auscultation bilaterally Cardio: regular rate and rhythm, S1, S2 normal, no murmur, click, rub or gallop GI: Soft. Active bowel sounds. Ileostomy patent. Incision healing well with resolving ecchymosis.  Disposition: 01-Home or Self Care     Medication List         acetaminophen 500 MG tablet  Commonly known as:  TYLENOL  Take 500 mg by mouth daily as needed.     ALEVE 220 MG tablet  Generic drug:  naproxen sodium  Take 220 mg by mouth  daily as needed. For pain     amLODipine 10 MG tablet  Commonly known as:  NORVASC  Take 10 mg by mouth every evening.     Fish Oil 1000 MG Caps  Take 2-3 capsules by mouth every morning.     HYDROcodone-acetaminophen 5-325 MG per tablet  Commonly known as:  NORCO  Take 1 tablet by mouth every 4 (four) hours as needed for pain.     multivitamin tablet  Take 1 tablet by mouth every morning.     multivitamins ther. w/minerals Tabs  Take 1 tablet by mouth daily.     valsartan 320 MG tablet  Commonly known as:  DIOVAN  Take 320 mg by mouth daily.           Follow-up Information   Follow up with Jamesetta So, MD. Schedule an appointment as soon as possible for a visit on 06/14/2013.   Contact information:   [address] [place] [id number] [phone]       Signed: Aviva Signs A 06/09/2013, 9:33 AM

## 2013-06-10 ENCOUNTER — Emergency Department (HOSPITAL_COMMUNITY)
Admission: EM | Admit: 2013-06-10 | Discharge: 2013-06-10 | Disposition: A | Discharge status: Home / Self Care | Payer: Medicare Other | Attending: Emergency Medicine | Admitting: Emergency Medicine

## 2013-06-10 ENCOUNTER — Encounter (HOSPITAL_COMMUNITY): Payer: Self-pay

## 2013-06-10 DIAGNOSIS — R339 Retention of urine, unspecified: Secondary | ICD-10-CM

## 2013-06-10 DIAGNOSIS — R011 Cardiac murmur, unspecified: Secondary | ICD-10-CM | POA: Insufficient documentation

## 2013-06-10 DIAGNOSIS — Z880 Allergy status to penicillin: Secondary | ICD-10-CM | POA: Insufficient documentation

## 2013-06-10 DIAGNOSIS — Z79899 Other long term (current) drug therapy: Secondary | ICD-10-CM | POA: Insufficient documentation

## 2013-06-10 DIAGNOSIS — Z8701 Personal history of pneumonia (recurrent): Secondary | ICD-10-CM | POA: Insufficient documentation

## 2013-06-10 DIAGNOSIS — R300 Dysuria: Secondary | ICD-10-CM | POA: Insufficient documentation

## 2013-06-10 DIAGNOSIS — I10 Essential (primary) hypertension: Secondary | ICD-10-CM | POA: Insufficient documentation

## 2013-06-10 DIAGNOSIS — Z8739 Personal history of other diseases of the musculoskeletal system and connective tissue: Secondary | ICD-10-CM | POA: Insufficient documentation

## 2013-06-10 DIAGNOSIS — Z8719 Personal history of other diseases of the digestive system: Secondary | ICD-10-CM | POA: Insufficient documentation

## 2013-06-10 MED ORDER — ONDANSETRON 4 MG PO TBDP: 4.0000 mg | ORAL_TABLET | Freq: Three times a day (TID) | ORAL | Status: DC | PRN

## 2013-06-10 MED ORDER — ONDANSETRON HCL 4 MG/2ML IJ SOLN
4.0000 mg | Freq: Once | INTRAMUSCULAR | Status: AC
  Administered 2013-06-10: 4 mg via INTRAVENOUS
  Filled 2013-06-10: qty 2

## 2013-06-10 NOTE — ED Notes (Signed)
Pt just released from hospital yesterday, was admitted for SBO

## 2013-06-10 NOTE — ED Provider Notes (Signed)
History    CSN: [id number] Arrival date & time 06/10/13  0140  First MD Initiated Contact with Patient 06/10/13 0253     Chief Complaint  Patient presents with  . Urinary Retention   (Consider location/radiation/quality/duration/timing/severity/associated sxs/prior Treatment) HPI HPI Comments: Nicole Cochran is a 78 y.o. female with a h/o Crohn's disease s/p colectomy with ileostomy in 2002 and recent admission to [facility] with discharge yesterday s/p exploratory lap for adhesions causing a  Bowel obstruction who presents to the Emergency Department complaining of inability to urinate. She has not urinated since 6 PM last night. Abdominal pain is increasing with her inability to urinate.  PCP Dr. Nevada Crane Surgeon Dr. Arnoldo Morale  Past Medical History  Diagnosis Date  . Crohn's   . Hypertension   . Arthritis   . Right knee pain   . Heart murmur   . Pneumonia     78 years old,  and 2002   Past Surgical History  Procedure Laterality Date  . Appendectomy    . Tubal ligation    . Nasal sinus surgery    . Ileostomy  2002  . Subtotal colectomy  2002  . Cataract extraction Bilateral 2009  . Laparotomy N/A 06/04/2013    Procedure: EXPLORATORY LAPAROTOMY;  Surgeon: Jamesetta So, MD;  Location: [facility];  Service: General;  Laterality: N/A;   parastomal hernia repair  . Lysis of adhesion N/A 06/04/2013    Procedure: LYSIS OF ADHESION;  Surgeon: Jamesetta So, MD;  Location: [facility];  Service: General;  Laterality: N/A;   History reviewed. No pertinent family history. History  Substance Use Topics  . Smoking status: Never Smoker   . Smokeless tobacco: Never Used  . Alcohol Use: No   OB History   Grav Para Term Preterm Abortions TAB SAB Ect Mult Living                 Review of Systems  Constitutional: Negative for fever.       10 Systems reviewed and are negative for acute change except as noted in the HPI.  HENT: Negative for congestion.   Eyes: Negative for discharge and redness.   Respiratory: Negative for cough and shortness of breath.   Cardiovascular: Negative for chest pain.  Gastrointestinal: Negative for vomiting and abdominal pain.  Genitourinary: Positive for difficulty urinating.  Musculoskeletal: Negative for back pain.  Skin: Negative for rash.  Neurological: Negative for syncope, numbness and headaches.  Psychiatric/Behavioral:       No behavior change.    Allergies  Penicillins and Tape  Home Medications   Current Outpatient Rx  Name  Route  Sig  Dispense  Refill  . acetaminophen (TYLENOL) 500 MG tablet   Oral   Take 500 mg by mouth daily as needed.         Marland Kitchen amLODipine (NORVASC) 10 MG tablet   Oral   Take 10 mg by mouth every evening.          Marland Kitchen HYDROcodone-acetaminophen (NORCO) 5-325 MG per tablet   Oral   Take 1 tablet by mouth every 4 (four) hours as needed for pain.   40 tablet   0   . Multiple Vitamin (MULTIVITAMIN) tablet   Oral   Take 1 tablet by mouth every morning.          . Multiple Vitamins-Minerals (MULTIVITAMINS THER. W/MINERALS) TABS   Oral   Take 1 tablet by mouth daily.         Marland Kitchen  naproxen sodium (ALEVE) 220 MG tablet   Oral   Take 220 mg by mouth daily as needed. For pain         . Omega-3 Fatty Acids (FISH OIL) 1000 MG CAPS   Oral   Take 2-3 capsules by mouth every morning.          . valsartan (DIOVAN) 320 MG tablet   Oral   Take 320 mg by mouth daily.          BP 151/80  Pulse 71  Temp(Src) 98 F (36.7 C) (Oral)  Resp 23  Ht 5' 2"  (1.575 m)  Wt 160 lb (72.576 kg)  BMI 29.26 kg/m2  SpO2 97% Physical Exam  Nursing note and vitals reviewed. Constitutional:  Awake, alert, nontoxic appearance.  HENT:  Head: Normocephalic and atraumatic.  Eyes: EOM are normal. Pupils are equal, round, and reactive to light.  Neck: Normal range of motion. Neck supple.  Cardiovascular: Normal rate and intact distal pulses.   Pulmonary/Chest: Effort normal and breath sounds normal. She exhibits no  tenderness.  Abdominal: Soft. Bowel sounds are normal. There is no tenderness. There is no rebound.  Midline staples incision with staples intact. ileostomy in LLQ.  Musculoskeletal: She exhibits no tenderness.  Baseline ROM, no obvious new focal weakness.  Neurological:  Mental status and motor strength appears baseline for patient and situation.  Skin: No rash noted.  Psychiatric: She has a normal mood and affect.    ED Course  Procedures (including critical care time)  0220 Foley catheter placed. Immediate release of urine with relief of abdominal pain. Patient vomited large amount of brown emesis. Given Zofran. 0300 Feels better. Wants to leave foley in. Will call hospital later today for update. Call to Dr. Arnoldo Morale office on Monday for appointment on Thursday. MDM  Patient s/p exploratory lap here with urinary retention. Foley placed with good results. Patient to go home with zofran. She is to call the hospital today for update and Dr. Arnoldo Morale office on Monday for a follow up appointment on Thursday. Pt stable in ED with no significant deterioration in condition.The patient appears reasonably screened and/or stabilized for discharge and I doubt any other medical condition or other [facility] requiring further screening, evaluation, or treatment in the ED at this time prior to discharge.  MDM Reviewed: nursing note, vitals and previous chart Reviewed previous: labs, x-Riester and CT scan     Gypsy Balsam. Olin Hauser, MD 06/10/13 [phone]

## 2013-06-10 NOTE — ED Notes (Signed)
Pt had large emesis, EDP aware

## 2013-06-10 NOTE — ED Notes (Signed)
Pt now co spitting up blood.

## 2013-06-10 NOTE — ED Notes (Signed)
Pt states has not urinated since 6pm yesterday

## 2013-09-03 ENCOUNTER — Other Ambulatory Visit (HOSPITAL_COMMUNITY): Payer: Self-pay | Admitting: Internal Medicine

## 2013-09-03 DIAGNOSIS — Z139 Encounter for screening, unspecified: Secondary | ICD-10-CM

## 2013-09-04 ENCOUNTER — Ambulatory Visit (HOSPITAL_COMMUNITY)
Admission: RE | Admit: 2013-09-04 | Discharge: 2013-09-04 | Disposition: A | Discharge status: Home / Self Care | Payer: Medicare Other | Source: Ambulatory Visit | Attending: Internal Medicine | Admitting: Internal Medicine

## 2013-09-04 DIAGNOSIS — Z1231 Encounter for screening mammogram for malignant neoplasm of breast: Secondary | ICD-10-CM | POA: Insufficient documentation

## 2013-09-04 DIAGNOSIS — Z139 Encounter for screening, unspecified: Secondary | ICD-10-CM

## 2013-11-06 ENCOUNTER — Ambulatory Visit (INDEPENDENT_AMBULATORY_CARE_PROVIDER_SITE_OTHER): Payer: Medicare Other | Admitting: Internal Medicine

## 2013-11-06 ENCOUNTER — Encounter (INDEPENDENT_AMBULATORY_CARE_PROVIDER_SITE_OTHER): Payer: Self-pay | Admitting: Internal Medicine

## 2013-11-06 VITALS — BP 130/68 | HR 80 | Temp 97.8°F | Resp 16 | Ht 62.0 in | Wt 161.6 lb

## 2013-11-06 DIAGNOSIS — K5090 Crohn's disease, unspecified, without complications: Secondary | ICD-10-CM

## 2013-11-06 NOTE — Patient Instructions (Signed)
Notify if you have bleeding into ileostomy or persistent diarrhea.

## 2013-11-06 NOTE — Progress Notes (Signed)
Presenting complaint;  History of Crohn's disease.  Database;  Patient is 78 year old Caucasian female who has history of Crohn's colitis status post subtotal colectomy with ileostomy in 2002 for multiple colonic perforations. She has done well with exception of an episode of double vision colitis in 2006 and again in August 2011 her last sigmoidoscopy was in August 2011. She presented with small bowel obstruction in July this year and underwent laparotomy by Dr. Aviva Signs with lysis of adhesions and repair for peristomal hernia. She has one niece with Crohn's disease.   Subjective;  Patient states she is doing well. She denies rectal discharge or bleeding. She also denies melena or bleeding into ileostomy. She has good appetite. Her weight has been stable. She denies heartburn nausea vomiting. She continues to complain of pain in her left ankle which is worse when she is on her feet for long period. She is taking Aleve but not every day.         Current Medications: Current Outpatient Prescriptions  Medication Sig Dispense Refill  . acetaminophen (TYLENOL) 500 MG tablet Take 500 mg by mouth daily as needed.      Marland Kitchen amLODipine (NORVASC) 10 MG tablet Take 10 mg by mouth every evening.       Marland Kitchen HYDROcodone-acetaminophen (NORCO) 5-325 MG per tablet Take 1 tablet by mouth every 4 (four) hours as needed for pain.  40 tablet  0  . irbesartan (AVAPRO) 300 MG tablet Take 300 mg by mouth daily.      . Multiple Vitamin (MULTIVITAMIN) tablet Take 1 tablet by mouth every morning.       . naproxen sodium (ALEVE) 220 MG tablet Take 220 mg by mouth daily as needed. For pain      . Omega-3 Fatty Acids (FISH OIL) 1000 MG CAPS Take 2-3 capsules by mouth every morning.       . ondansetron (ZOFRAN ODT) 4 MG disintegrating tablet Take 1 tablet (4 mg total) by mouth every 8 (eight) hours as needed for nausea.  20 tablet  0   No current facility-administered medications for this visit.      Objective: Blood pressure 130/68, pulse 80, temperature 97.8 F (36.6 C), temperature source Oral, resp. rate 16, height 5' 2"  (1.575 m), weight 161 lb 9.6 oz (73.301 kg). Patient is alert and in no acute distress. Conjunctiva is pink. Sclera is nonicteric Oropharyngeal mucosa is normal. No neck masses or thyromegaly noted. Cardiac exam with regular rhythm normal S1 and S2. She has grade 2/6 systolic ejection murmur best heard at aortic area. Lungs are clear to auscultation. Abdomen. She has ileostomy in right lower quadrant. Ileostomy bag has thick greenish liquid stool. Mucosa at ileostomy site appears to be healthy. Abdomen is soft and nontender without organomegaly or masses.  No LE edema or clubbing noted.  Labs/studies Results: CBC from 06/08/2013 revealed WBC of 8.1, H&H of 12.2 and 35.4 and platelet count of 342K. Patient reports having complete blood work last month and was normal.   Assessment:  #1. His tree of Crohn's colitis. Status post subtotal colectomy with ileostomy 14 years ago. She remains in remission. Patient uses Advil on an as-needed basis which is better than using it daily. #2. Patient has rectal or rectosigmoid stump. She has history of diversion colitis but presently without symptoms. This segment was last examined in August 2011.    Plan:  Patient advised to keep Aleve use to minimum. She should consider follow with Dr. Aline Brochure since she is  having daily pain involving left ankle. Office visit in one year.

## 2014-08-13 ENCOUNTER — Ambulatory Visit (INDEPENDENT_AMBULATORY_CARE_PROVIDER_SITE_OTHER): Payer: Medicare Other | Admitting: Internal Medicine

## 2014-08-13 ENCOUNTER — Encounter (INDEPENDENT_AMBULATORY_CARE_PROVIDER_SITE_OTHER): Payer: Self-pay | Admitting: Internal Medicine

## 2014-08-13 VITALS — BP 140/74 | HR 78 | Temp 97.8°F | Resp 18 | Ht 62.0 in | Wt 159.1 lb

## 2014-08-13 DIAGNOSIS — K5090 Crohn's disease, unspecified, without complications: Secondary | ICD-10-CM

## 2014-08-13 DIAGNOSIS — K9400 Colostomy complication, unspecified: Secondary | ICD-10-CM

## 2014-08-13 DIAGNOSIS — T8189XA Other complications of procedures, not elsewhere classified, initial encounter: Secondary | ICD-10-CM

## 2014-08-13 DIAGNOSIS — IMO0002 Reserved for concepts with insufficient information to code with codable children: Secondary | ICD-10-CM

## 2014-08-13 DIAGNOSIS — K50919 Crohn's disease, unspecified, with unspecified complications: Secondary | ICD-10-CM

## 2014-08-13 NOTE — Patient Instructions (Addendum)
Consultation with colostomy nurse Office visit with Dr. Arnoldo Morale.  Please check with Dr. Nevada Crane about getting bone density study.

## 2014-08-13 NOTE — Progress Notes (Signed)
Presenting complaint;  Knot umbilicus and peeling of peristomal skin.  Database;  Patient is 79 year old Caucasian female who has history of Crohn's colitis status post subtotal colectomy with ileostomy in 2002 for multiple colonic perforations. She has done well with exception of an episode of diversion colitis in 2006 and again in August 2011.  Last sigmoidoscopy was in August 2011.  She presented with small bowel obstruction in July, 2014 for small bowel obstruction and underwent laparotomy by Dr. Aviva Signs with lysis of adhesions and repair for peristomal hernia.  She was last seen in December 2014 and was doing well. Family history is significant for Crohn's disease in one niece.  Subjective:  Patient presents with 2 complaints. She has noted a nodule at the umbilicus which bleeds every now and then. She is concerned that she may have umbilicus hernia. Her second complaint is that she has noted skin tear or peeling around the colostomy site. This started 4-5 months ago and not going away. She has been using Kenalog powder. She wonders if this problem is because of gradual weight gain that she had in the last 13 years. She has good appetite. She has lost 2 pounds in the last 9 months. Ileostomy output has not changed. She denies melena or bleeding into ileostomy. She has occasional rectal discharge which is clear. Patient states she's been under a lot of stress recently. Her sister suffered CVA and is not doing well and her niece who is 29 years old was diagnosed with ovarian cancer Patient does not take OTC NSAIDs. She believes her last bone density study was 3 years ago.   Current Medications: Outpatient Encounter Prescriptions as of 08/13/2014  Medication Sig  . acetaminophen (TYLENOL) 500 MG tablet Take 500 mg by mouth daily as needed.  Marland Kitchen amLODipine (NORVASC) 10 MG tablet Take 10 mg by mouth every evening.   . irbesartan (AVAPRO) 300 MG tablet Take 300 mg by mouth daily.  .  Multiple Vitamin (MULTIVITAMIN) tablet Take 1 tablet by mouth every morning.   . naproxen sodium (ALEVE) 220 MG tablet Take 220 mg by mouth daily as needed. For pain  . Omega-3 Fatty Acids (FISH OIL) 1000 MG CAPS Take 2-3 capsules by mouth every morning.   . ondansetron (ZOFRAN ODT) 4 MG disintegrating tablet Take 1 tablet (4 mg total) by mouth every 8 (eight) hours as needed for nausea.  . [DISCONTINUED] HYDROcodone-acetaminophen (NORCO) 5-325 MG per tablet Take 1 tablet by mouth every 4 (four) hours as needed for pain.    Objective: Blood pressure 140/74, pulse 78, temperature 97.8 F (36.6 C), temperature source Oral, resp. rate 18, height 5' 2"  (1.575 m), weight 159 lb 1.6 oz (72.167 kg). Patient is alert and in no acute distress. Conjunctiva is pink. Sclera is nonicteric Oropharyngeal mucosa is normal. No neck masses or thyromegaly noted. Cardiac exam with regular rhythm normal S1 and S2. No murmur or gallop noted. Lungs are clear to auscultation. Abdomen. Ileostomy is located in the right lower quadrant and back contains greenish brown liquid stool. There is focal thickening to skin and umbilicus with redness and is covered with dried blood. Abdomen is soft with mild tenderness in epigastrium and perineum blocker region. No organomegaly or masses. Skin around the ileostomy site could not be examined as it is covered with ileostomy bag.  No LE edema or clubbing noted.   Assessment:  #1. History of colonic Crohn's disease. Status post subtotal colectomy 13 years ago and she remains in remission. #  2. Peristomal skin tear or peeling. Not sure if this is an allergic reaction. She will need help of colostomy nurse. #3. Possible suture granuloma at umbilicus.   Plan:  Referral to colostomy nurse. Office visit with Dr. Aviva Signs. Patient advised to check with Dr. Wende Neighbors if it is time for her to undergo bone density. Patient advised to take calcium with vitamin D 2 tablets  daily. Office visit in 6 months.

## 2014-09-10 ENCOUNTER — Other Ambulatory Visit (HOSPITAL_COMMUNITY): Payer: Self-pay | Admitting: Internal Medicine

## 2014-09-10 DIAGNOSIS — Z1231 Encounter for screening mammogram for malignant neoplasm of breast: Secondary | ICD-10-CM

## 2014-09-13 ENCOUNTER — Ambulatory Visit (HOSPITAL_COMMUNITY)
Admission: RE | Admit: 2014-09-13 | Discharge: 2014-09-13 | Disposition: A | Discharge status: Home / Self Care | Payer: Medicare Other | Source: Ambulatory Visit | Attending: Internal Medicine | Admitting: Internal Medicine

## 2014-09-13 DIAGNOSIS — Z1231 Encounter for screening mammogram for malignant neoplasm of breast: Secondary | ICD-10-CM | POA: Insufficient documentation

## 2015-02-11 ENCOUNTER — Ambulatory Visit (INDEPENDENT_AMBULATORY_CARE_PROVIDER_SITE_OTHER): Payer: Medicare Other | Admitting: Internal Medicine

## 2015-04-21 ENCOUNTER — Encounter (INDEPENDENT_AMBULATORY_CARE_PROVIDER_SITE_OTHER): Payer: Self-pay | Admitting: Internal Medicine

## 2015-04-21 ENCOUNTER — Ambulatory Visit (INDEPENDENT_AMBULATORY_CARE_PROVIDER_SITE_OTHER): Payer: Medicare Other | Admitting: Internal Medicine

## 2015-04-21 VITALS — BP 130/74 | HR 72 | Temp 97.5°F | Resp 18 | Ht 62.0 in | Wt 156.5 lb

## 2015-04-21 DIAGNOSIS — Z932 Ileostomy status: Secondary | ICD-10-CM | POA: Diagnosis not present

## 2015-04-21 DIAGNOSIS — K50919 Crohn's disease, unspecified, with unspecified complications: Secondary | ICD-10-CM | POA: Diagnosis not present

## 2015-04-21 NOTE — Progress Notes (Addendum)
Presenting complaint;  History of Crohn's disease. Patient has ileostomy.  Subjective:  Nicole Cochran is 80 year old Caucasian female who is here for scheduled visit. She was last seen in September 2015. She was having problems with her ileostomy and she was referred to ostomy nurse at [facility]. She was tried on a different back but it did not work. She was given a few tips on careful ostomy. She is not having any problems with skin irritation or swelling. She says only time she gets irritation and stoma is when she gets stressed ulcer upset. She states she feels well. She has not taken Zofran in over 12 months. She denies change in ileostomy output. She also denies bleeding or melena and lastly. She has sporadic clear rectal discharge. She decided to retire in November last year. She went on a trip to [place] 2 months ago.   Current Medications: Outpatient Encounter Prescriptions as of 04/21/2015  Medication Sig  . amLODipine (NORVASC) 10 MG tablet Take 10 mg by mouth every evening.   . irbesartan (AVAPRO) 300 MG tablet Take 300 mg by mouth daily.  . Multiple Vitamin (MULTIVITAMIN) tablet Take 1 tablet by mouth every morning.   . Omega-3 Fatty Acids (FISH OIL) 1000 MG CAPS Take 2-3 capsules by mouth every morning.   . ondansetron (ZOFRAN ODT) 4 MG disintegrating tablet Take 1 tablet (4 mg total) by mouth every 8 (eight) hours as needed for nausea.  . [DISCONTINUED] acetaminophen (TYLENOL) 500 MG tablet Take 500 mg by mouth daily as needed.  . [DISCONTINUED] naproxen sodium (ALEVE) 220 MG tablet Take 220 mg by mouth daily as needed. For pain   No facility-administered encounter medications on file as of 04/21/2015.     Objective: Blood pressure 130/74, pulse 72, temperature 97.5 F (36.4 C), temperature source Oral, resp. rate 18, height 5' 2"  (1.575 m), weight 156 lb 8 oz (70.988 kg). Patient is alert and in no acute distress. Conjunctiva is pink. Sclera is nonicteric Oropharyngeal mucosa is  normal. No neck masses or thyromegaly noted. Cardiac exam with regular rhythm normal S1 and S2. Faint systolic ejection murmur noted at left sternal border. Lungs are clear to auscultation. Abdomen. Ileostomy is located in right low quadrant of her abdomen. Ileostomy bag has brownish liquid stool. Abdomen is soft and nontender without organomegaly or masses.  No LE edema or clubbing noted.   Assessment:  #1. History of colitis. She is status post subtotal colectomy with ileostomy in April 2002. She still has rectum in place. She was treated for proctitis in September 2006. She underwent laparotomy for small bowel obstruction in July 2014 due to adhesions and did not require bowel resection. She is not having any problems with ileostomy. She appears to be in remission.  Plan:  Patient will call if she has rectal discharge or bleeding. Office visit in one year.

## 2015-04-21 NOTE — Patient Instructions (Signed)
Notify if you have rectal discharge or bleeding.

## 2015-05-13 ENCOUNTER — Ambulatory Visit (INDEPENDENT_AMBULATORY_CARE_PROVIDER_SITE_OTHER): Payer: Self-pay | Admitting: Internal Medicine

## 2015-08-25 ENCOUNTER — Other Ambulatory Visit (HOSPITAL_COMMUNITY): Payer: Self-pay | Admitting: Internal Medicine

## 2015-08-25 DIAGNOSIS — Z1231 Encounter for screening mammogram for malignant neoplasm of breast: Secondary | ICD-10-CM

## 2015-09-15 ENCOUNTER — Ambulatory Visit (HOSPITAL_COMMUNITY)
Admission: RE | Admit: 2015-09-15 | Discharge: 2015-09-15 | Disposition: A | Discharge status: Home / Self Care | Payer: Medicare Other | Source: Ambulatory Visit | Attending: Internal Medicine | Admitting: Internal Medicine

## 2015-09-15 DIAGNOSIS — Z1231 Encounter for screening mammogram for malignant neoplasm of breast: Secondary | ICD-10-CM | POA: Insufficient documentation

## 2015-12-11 DIAGNOSIS — H20021 Recurrent acute iridocyclitis, right eye: Secondary | ICD-10-CM | POA: Diagnosis not present

## 2015-12-18 DIAGNOSIS — H20011 Primary iridocyclitis, right eye: Secondary | ICD-10-CM | POA: Diagnosis not present

## 2015-12-30 DIAGNOSIS — H40003 Preglaucoma, unspecified, bilateral: Secondary | ICD-10-CM | POA: Diagnosis not present

## 2016-01-13 DIAGNOSIS — H40003 Preglaucoma, unspecified, bilateral: Secondary | ICD-10-CM | POA: Diagnosis not present

## 2016-01-15 ENCOUNTER — Encounter (INDEPENDENT_AMBULATORY_CARE_PROVIDER_SITE_OTHER): Payer: Self-pay | Admitting: Internal Medicine

## 2016-01-21 DIAGNOSIS — H5461 Unqualified visual loss, right eye, normal vision left eye: Secondary | ICD-10-CM | POA: Diagnosis not present

## 2016-01-21 DIAGNOSIS — H547 Unspecified visual loss: Secondary | ICD-10-CM | POA: Diagnosis not present

## 2016-01-21 DIAGNOSIS — H538 Other visual disturbances: Secondary | ICD-10-CM | POA: Diagnosis not present

## 2016-01-21 DIAGNOSIS — R9082 White matter disease, unspecified: Secondary | ICD-10-CM | POA: Diagnosis not present

## 2016-02-06 DIAGNOSIS — Z933 Colostomy status: Secondary | ICD-10-CM | POA: Diagnosis not present

## 2016-02-06 DIAGNOSIS — Z932 Ileostomy status: Secondary | ICD-10-CM | POA: Diagnosis not present

## 2016-02-06 DIAGNOSIS — K8051 Calculus of bile duct without cholangitis or cholecystitis with obstruction: Secondary | ICD-10-CM | POA: Diagnosis not present

## 2016-02-11 DIAGNOSIS — H40003 Preglaucoma, unspecified, bilateral: Secondary | ICD-10-CM | POA: Diagnosis not present

## 2016-03-03 DIAGNOSIS — H40003 Preglaucoma, unspecified, bilateral: Secondary | ICD-10-CM | POA: Diagnosis not present

## 2016-03-22 DIAGNOSIS — B352 Tinea manuum: Secondary | ICD-10-CM | POA: Diagnosis not present

## 2016-03-22 DIAGNOSIS — B353 Tinea pedis: Secondary | ICD-10-CM | POA: Diagnosis not present

## 2016-03-23 DIAGNOSIS — H40003 Preglaucoma, unspecified, bilateral: Secondary | ICD-10-CM | POA: Diagnosis not present

## 2016-03-25 DIAGNOSIS — Z933 Colostomy status: Secondary | ICD-10-CM | POA: Diagnosis not present

## 2016-03-25 DIAGNOSIS — K8051 Calculus of bile duct without cholangitis or cholecystitis with obstruction: Secondary | ICD-10-CM | POA: Diagnosis not present

## 2016-03-25 DIAGNOSIS — Z932 Ileostomy status: Secondary | ICD-10-CM | POA: Diagnosis not present

## 2016-04-06 DIAGNOSIS — E782 Mixed hyperlipidemia: Secondary | ICD-10-CM | POA: Diagnosis not present

## 2016-04-06 DIAGNOSIS — H40003 Preglaucoma, unspecified, bilateral: Secondary | ICD-10-CM | POA: Diagnosis not present

## 2016-04-06 DIAGNOSIS — R7301 Impaired fasting glucose: Secondary | ICD-10-CM | POA: Diagnosis not present

## 2016-04-06 DIAGNOSIS — E039 Hypothyroidism, unspecified: Secondary | ICD-10-CM | POA: Diagnosis not present

## 2016-04-09 DIAGNOSIS — I10 Essential (primary) hypertension: Secondary | ICD-10-CM | POA: Diagnosis not present

## 2016-04-09 DIAGNOSIS — E039 Hypothyroidism, unspecified: Secondary | ICD-10-CM | POA: Diagnosis not present

## 2016-04-09 DIAGNOSIS — R7301 Impaired fasting glucose: Secondary | ICD-10-CM | POA: Diagnosis not present

## 2016-04-09 DIAGNOSIS — E782 Mixed hyperlipidemia: Secondary | ICD-10-CM | POA: Diagnosis not present

## 2016-04-20 ENCOUNTER — Ambulatory Visit (INDEPENDENT_AMBULATORY_CARE_PROVIDER_SITE_OTHER): Payer: PPO | Admitting: Internal Medicine

## 2016-04-20 ENCOUNTER — Encounter (INDEPENDENT_AMBULATORY_CARE_PROVIDER_SITE_OTHER): Payer: Self-pay | Admitting: Internal Medicine

## 2016-04-20 VITALS — BP 130/70 | HR 66 | Temp 98.2°F | Resp 18 | Ht 62.0 in | Wt 161.9 lb

## 2016-04-20 DIAGNOSIS — K50919 Crohn's disease, unspecified, with unspecified complications: Secondary | ICD-10-CM

## 2016-04-20 DIAGNOSIS — Z932 Ileostomy status: Secondary | ICD-10-CM

## 2016-04-20 NOTE — Patient Instructions (Signed)
Call if you have rectal bleeding

## 2016-04-20 NOTE — Progress Notes (Signed)
Presenting complaint;  Follow-up for Crohn's disease.  Subjective:  Nicole Cochran is 81 year old Caucasian female who is here for scheduled visit. She underwent subtotal colectomy with ileostomy in 2002 at [facility] and has remained in remission. She did have mild episode of diversion colitis responding to topical therapy in 2012. She was last seen one year ago. She has no complaints. She denies abdominal pain rectal discharge or bleeding. She has good appetite. She denies nausea or vomiting. She states she empties ileostomy bag every 2-1/2 hours while she is awake and once every night. She denies bleeding or melena. She is still working. She works 24 hours a week at [employer]. Walks regularly and does yard work.    Current Medications: Outpatient Encounter Prescriptions as of 04/20/2016  Medication Sig  . amLODipine (NORVASC) 10 MG tablet Take 5 mg by mouth every evening.   . irbesartan (AVAPRO) 300 MG tablet Take 300 mg by mouth daily.  Marland Kitchen levothyroxine (SYNTHROID, LEVOTHROID) 25 MCG tablet Take 25 mcg by mouth daily before breakfast.  . Multiple Vitamin (MULTIVITAMIN) tablet Take 1 tablet by mouth every morning.   . Omega-3 Fatty Acids (FISH OIL) 1000 MG CAPS Take 2-3 capsules by mouth every morning.   . ondansetron (ZOFRAN ODT) 4 MG disintegrating tablet Take 1 tablet (4 mg total) by mouth every 8 (eight) hours as needed for nausea.   No facility-administered encounter medications on file as of 04/20/2016.     Objective: Blood pressure 130/70, pulse 66, temperature 98.2 F (36.8 C), temperature source Oral, resp. rate 18, height 5' 2"  (1.575 m), weight 161 lb 14.4 oz (73.437 kg). Patient is alert and in no acute distress. Conjunctiva is pink. Sclera is nonicteric Oropharyngeal mucosa is normal. No neck masses or thyromegaly noted. Cardiac exam with regular rhythm normal S1 and S2. Faint SEM noted at left sternal border. Lungs are clear to auscultation. Abdomen. She has midline scar. Ileostomy is  located in right low quadrant. Ileostomy bag contains brownish stool. Abdomen is soft and nontender without organomegaly or masses. No LE edema or clubbing noted.   Assessment:  #1. Crohn's disease. She has remained in remission since she has subtotal colectomy and ileostomy in 2002. She had laparotomy in 2014 for small bowel obstruction secondary to adhesions. She had flexible sigmoidoscopy in December 2012 feeling mild diversion colitis.   Plan:  Will request copy of recent blood work from PCPs office. Patient will call if she has rectal bleeding discharge or change in ileostomy output. Office visit in one year.

## 2016-04-29 DIAGNOSIS — K8051 Calculus of bile duct without cholangitis or cholecystitis with obstruction: Secondary | ICD-10-CM | POA: Diagnosis not present

## 2016-04-29 DIAGNOSIS — Z932 Ileostomy status: Secondary | ICD-10-CM | POA: Diagnosis not present

## 2016-04-29 DIAGNOSIS — Z933 Colostomy status: Secondary | ICD-10-CM | POA: Diagnosis not present

## 2016-05-12 DIAGNOSIS — R5383 Other fatigue: Secondary | ICD-10-CM | POA: Diagnosis not present

## 2016-05-12 DIAGNOSIS — E039 Hypothyroidism, unspecified: Secondary | ICD-10-CM | POA: Diagnosis not present

## 2016-06-12 DIAGNOSIS — K8051 Calculus of bile duct without cholangitis or cholecystitis with obstruction: Secondary | ICD-10-CM | POA: Diagnosis not present

## 2016-06-12 DIAGNOSIS — Z932 Ileostomy status: Secondary | ICD-10-CM | POA: Diagnosis not present

## 2016-06-12 DIAGNOSIS — Z933 Colostomy status: Secondary | ICD-10-CM | POA: Diagnosis not present

## 2016-07-20 DIAGNOSIS — Z933 Colostomy status: Secondary | ICD-10-CM | POA: Diagnosis not present

## 2016-07-20 DIAGNOSIS — K8051 Calculus of bile duct without cholangitis or cholecystitis with obstruction: Secondary | ICD-10-CM | POA: Diagnosis not present

## 2016-07-20 DIAGNOSIS — Z932 Ileostomy status: Secondary | ICD-10-CM | POA: Diagnosis not present

## 2016-07-21 DIAGNOSIS — Z932 Ileostomy status: Secondary | ICD-10-CM | POA: Diagnosis not present

## 2016-07-21 DIAGNOSIS — Z933 Colostomy status: Secondary | ICD-10-CM | POA: Diagnosis not present

## 2016-07-21 DIAGNOSIS — K8051 Calculus of bile duct without cholangitis or cholecystitis with obstruction: Secondary | ICD-10-CM | POA: Diagnosis not present

## 2016-08-23 DIAGNOSIS — Z932 Ileostomy status: Secondary | ICD-10-CM | POA: Diagnosis not present

## 2016-08-23 DIAGNOSIS — K8051 Calculus of bile duct without cholangitis or cholecystitis with obstruction: Secondary | ICD-10-CM | POA: Diagnosis not present

## 2016-08-23 DIAGNOSIS — Z933 Colostomy status: Secondary | ICD-10-CM | POA: Diagnosis not present

## 2016-10-08 DIAGNOSIS — Z932 Ileostomy status: Secondary | ICD-10-CM | POA: Diagnosis not present

## 2016-10-08 DIAGNOSIS — Z933 Colostomy status: Secondary | ICD-10-CM | POA: Diagnosis not present

## 2016-10-08 DIAGNOSIS — K8051 Calculus of bile duct without cholangitis or cholecystitis with obstruction: Secondary | ICD-10-CM | POA: Diagnosis not present

## 2016-10-12 DIAGNOSIS — E039 Hypothyroidism, unspecified: Secondary | ICD-10-CM | POA: Diagnosis not present

## 2016-10-12 DIAGNOSIS — E782 Mixed hyperlipidemia: Secondary | ICD-10-CM | POA: Diagnosis not present

## 2016-10-12 DIAGNOSIS — I10 Essential (primary) hypertension: Secondary | ICD-10-CM | POA: Diagnosis not present

## 2016-10-15 DIAGNOSIS — E039 Hypothyroidism, unspecified: Secondary | ICD-10-CM | POA: Diagnosis not present

## 2016-10-15 DIAGNOSIS — E782 Mixed hyperlipidemia: Secondary | ICD-10-CM | POA: Diagnosis not present

## 2016-10-15 DIAGNOSIS — R809 Proteinuria, unspecified: Secondary | ICD-10-CM | POA: Diagnosis not present

## 2016-10-15 DIAGNOSIS — R7301 Impaired fasting glucose: Secondary | ICD-10-CM | POA: Diagnosis not present

## 2016-10-15 DIAGNOSIS — Z6829 Body mass index (BMI) 29.0-29.9, adult: Secondary | ICD-10-CM | POA: Diagnosis not present

## 2016-10-15 DIAGNOSIS — I10 Essential (primary) hypertension: Secondary | ICD-10-CM | POA: Diagnosis not present

## 2016-11-15 DIAGNOSIS — Z933 Colostomy status: Secondary | ICD-10-CM | POA: Diagnosis not present

## 2016-11-15 DIAGNOSIS — Z932 Ileostomy status: Secondary | ICD-10-CM | POA: Diagnosis not present

## 2016-11-15 DIAGNOSIS — K8051 Calculus of bile duct without cholangitis or cholecystitis with obstruction: Secondary | ICD-10-CM | POA: Diagnosis not present

## 2016-12-16 DIAGNOSIS — K8051 Calculus of bile duct without cholangitis or cholecystitis with obstruction: Secondary | ICD-10-CM | POA: Diagnosis not present

## 2016-12-16 DIAGNOSIS — Z932 Ileostomy status: Secondary | ICD-10-CM | POA: Diagnosis not present

## 2016-12-16 DIAGNOSIS — Z933 Colostomy status: Secondary | ICD-10-CM | POA: Diagnosis not present

## 2016-12-18 DIAGNOSIS — K8051 Calculus of bile duct without cholangitis or cholecystitis with obstruction: Secondary | ICD-10-CM | POA: Diagnosis not present

## 2016-12-18 DIAGNOSIS — Z933 Colostomy status: Secondary | ICD-10-CM | POA: Diagnosis not present

## 2016-12-18 DIAGNOSIS — Z932 Ileostomy status: Secondary | ICD-10-CM | POA: Diagnosis not present

## 2017-01-25 DIAGNOSIS — K8051 Calculus of bile duct without cholangitis or cholecystitis with obstruction: Secondary | ICD-10-CM | POA: Diagnosis not present

## 2017-01-25 DIAGNOSIS — Z933 Colostomy status: Secondary | ICD-10-CM | POA: Diagnosis not present

## 2017-01-25 DIAGNOSIS — Z932 Ileostomy status: Secondary | ICD-10-CM | POA: Diagnosis not present

## 2017-03-08 ENCOUNTER — Encounter: Payer: Self-pay | Admitting: Orthopedic Surgery

## 2017-03-08 ENCOUNTER — Ambulatory Visit (INDEPENDENT_AMBULATORY_CARE_PROVIDER_SITE_OTHER): Payer: PPO

## 2017-03-08 ENCOUNTER — Ambulatory Visit (INDEPENDENT_AMBULATORY_CARE_PROVIDER_SITE_OTHER): Payer: PPO | Admitting: Orthopedic Surgery

## 2017-03-08 VITALS — BP 163/77 | HR 69 | Ht 61.0 in | Wt 157.0 lb

## 2017-03-08 DIAGNOSIS — Z932 Ileostomy status: Secondary | ICD-10-CM | POA: Diagnosis not present

## 2017-03-08 DIAGNOSIS — Z933 Colostomy status: Secondary | ICD-10-CM | POA: Diagnosis not present

## 2017-03-08 DIAGNOSIS — M25561 Pain in right knee: Secondary | ICD-10-CM

## 2017-03-08 DIAGNOSIS — G8929 Other chronic pain: Secondary | ICD-10-CM | POA: Diagnosis not present

## 2017-03-08 DIAGNOSIS — K8051 Calculus of bile duct without cholangitis or cholecystitis with obstruction: Secondary | ICD-10-CM | POA: Diagnosis not present

## 2017-03-08 DIAGNOSIS — M1711 Unilateral primary osteoarthritis, right knee: Secondary | ICD-10-CM

## 2017-03-08 NOTE — Patient Instructions (Signed)

## 2017-03-08 NOTE — Progress Notes (Signed)
Patient ID: KOA PALLA, female   DOB: 1935-03-13, 82 y.o.   MRN: [id number]  Chief Complaint  Patient presents with  . Knee Pain    RIGHT KNEE PAIN    HPI Nicole Cochran is a 82 y.o. female.  The patient comes in today with request for reevaluation of her right knee. She fell several years ago I saw her placed her in a brace gave her an injection and she did very well. She says the knee is bothering her again primarily on the medial side although she's been able to continue working  Details of the pain related to this right knee: Location medial side right knee, quality dull, severity moderate, duration several years, timing constant but worse with exercise Review of Systems Review of Systems (2 MINIMUM)  No chest pain no shortness of breath no fever no rash over the knee  Past Medical History:  Diagnosis Date  . Arthritis   . Crohn's   . Heart murmur   . Hypertension   . Pneumonia    82 years old,  and 2002  . Right knee pain     Past Surgical History:  Procedure Laterality Date  . APPENDECTOMY    . CATARACT EXTRACTION Bilateral 2009  . COLONOSCOPY  2011   @ [place]  . ILEOSTOMY  2002  . LAPAROTOMY N/A 06/04/2013   Procedure: EXPLORATORY LAPAROTOMY;  Surgeon: Jamesetta So, MD;  Location: [facility];  Service: General;  Laterality: N/A;   parastomal hernia repair  . LYSIS OF ADHESION N/A 06/04/2013   Procedure: LYSIS OF ADHESION;  Surgeon: Jamesetta So, MD;  Location: [facility];  Service: General;  Laterality: N/A;  . NASAL SINUS SURGERY    . SUBTOTAL COLECTOMY  2002  . TUBAL LIGATION      Social History Social History  Substance Use Topics  . Smoking status: Never Smoker  . Smokeless tobacco: Never Used  . Alcohol use No    Allergies  Allergen Reactions  . Penicillins     rash  . Tape     Current Meds  Medication Sig  . amLODipine (NORVASC) 5 MG tablet Take 5 mg by mouth daily.  . Multiple Vitamin (MULTIVITAMIN) tablet Take 1 tablet by mouth every morning.   .  olmesartan-hydrochlorothiazide (BENICAR HCT) 40-12.5 MG tablet Take 1 tablet by mouth daily.      Physical Exam Physical Exam 1.BP (!) 163/77   Pulse 69   Ht 5' 1"  (1.549 m)   Wt 157 lb (71.2 kg)   BMI 29.66 kg/m   2. Gen. appearance. The patient is well-developed and well-nourished, grooming and hygiene are normal. There are no gross congenital abnormalities  3. The patient is alert and oriented to person place and time  4. Mood and affect are normal  5. Ambulation Remains normal and she is not using any devices for weight bearing Examination reveals the following: Right knee inspection reveals tenderness of the medial compartment no significant abnormalities in terms of alignment range of motion remains 120 or more she has good stability in all ligaments muscle tone and strength normal skin was intact pulses were good no edema distally sensation was normal in the right leg correlation balance remain normal  Her left knee seem to have normal alignment without contracture subluxation atrophy or tremor MEDICAL DECISION MAKING:    Data Reviewed The x-rays we have on her from today show varus osteoarthritis medial compartment with sclerosis subchondral cyst formation and mild osteophytes  Assessment Encounter Diagnosis  Name Primary?  . Primary osteoarthritis of right knee Yes     Plan Procedure note right knee injection verbal consent was obtained to inject right knee joint  Timeout was completed to confirm the site of injection  The medications used were 40 mg of Depo-Medrol and 1% lidocaine 3 cc  Anesthesia was provided by ethyl chloride and the skin was prepped with alcohol.  After cleaning the skin with alcohol a 20-gauge needle was used to inject the right knee joint. There were no complications. A sterile bandage was applied.    Arther Abbott 03/08/2017, 12:22 PM

## 2017-03-30 DIAGNOSIS — E039 Hypothyroidism, unspecified: Secondary | ICD-10-CM | POA: Diagnosis not present

## 2017-03-30 DIAGNOSIS — I10 Essential (primary) hypertension: Secondary | ICD-10-CM | POA: Diagnosis not present

## 2017-03-30 DIAGNOSIS — R7301 Impaired fasting glucose: Secondary | ICD-10-CM | POA: Diagnosis not present

## 2017-03-31 ENCOUNTER — Encounter (INDEPENDENT_AMBULATORY_CARE_PROVIDER_SITE_OTHER): Payer: Self-pay | Admitting: Internal Medicine

## 2017-04-04 DIAGNOSIS — R070 Pain in throat: Secondary | ICD-10-CM | POA: Diagnosis not present

## 2017-04-04 DIAGNOSIS — R7301 Impaired fasting glucose: Secondary | ICD-10-CM | POA: Diagnosis not present

## 2017-04-04 DIAGNOSIS — Z6828 Body mass index (BMI) 28.0-28.9, adult: Secondary | ICD-10-CM | POA: Diagnosis not present

## 2017-04-04 DIAGNOSIS — E039 Hypothyroidism, unspecified: Secondary | ICD-10-CM | POA: Diagnosis not present

## 2017-04-04 DIAGNOSIS — K5090 Crohn's disease, unspecified, without complications: Secondary | ICD-10-CM | POA: Diagnosis not present

## 2017-04-04 DIAGNOSIS — I10 Essential (primary) hypertension: Secondary | ICD-10-CM | POA: Diagnosis not present

## 2017-04-04 DIAGNOSIS — M069 Rheumatoid arthritis, unspecified: Secondary | ICD-10-CM | POA: Diagnosis not present

## 2017-04-04 DIAGNOSIS — R809 Proteinuria, unspecified: Secondary | ICD-10-CM | POA: Diagnosis not present

## 2017-04-04 DIAGNOSIS — E782 Mixed hyperlipidemia: Secondary | ICD-10-CM | POA: Diagnosis not present

## 2017-04-11 DIAGNOSIS — K8051 Calculus of bile duct without cholangitis or cholecystitis with obstruction: Secondary | ICD-10-CM | POA: Diagnosis not present

## 2017-04-11 DIAGNOSIS — Z932 Ileostomy status: Secondary | ICD-10-CM | POA: Diagnosis not present

## 2017-04-11 DIAGNOSIS — Z933 Colostomy status: Secondary | ICD-10-CM | POA: Diagnosis not present

## 2017-04-20 ENCOUNTER — Encounter (INDEPENDENT_AMBULATORY_CARE_PROVIDER_SITE_OTHER): Payer: Self-pay | Admitting: Internal Medicine

## 2017-04-20 ENCOUNTER — Ambulatory Visit (INDEPENDENT_AMBULATORY_CARE_PROVIDER_SITE_OTHER): Payer: PPO | Admitting: Internal Medicine

## 2017-04-20 VITALS — BP 144/50 | HR 64 | Temp 97.1°F | Ht 62.0 in | Wt 157.4 lb

## 2017-04-20 DIAGNOSIS — K5090 Crohn's disease, unspecified, without complications: Secondary | ICD-10-CM | POA: Diagnosis not present

## 2017-04-20 NOTE — Progress Notes (Addendum)
   Subjective:    Patient ID: Nicole Cochran, female    DOB: 1934-12-20, 82 y.o.   MRN: [id number]  HPI  Here today for f/u. Last seen in May of 2017. zhx of Crohn's disease. Underwent a subtotal colectomy with colostomy in 2002. Has remained in remission.  She continues to work full time at [employer]. She tells me she feels good all the time. She empties her bag every 2 hrs during the day. No melena or BRRB. Appetite is good. No weight loss.   5/2.2018 H and H 11.8 and 34.2, bili 0.5, ALP 61, AST 13, ALT 11.   Review of Systems Past Medical History:  Diagnosis Date  . Arthritis   . Crohn's   . Heart murmur   . Hypertension   . Pneumonia    82 years old,  and 2002  . Right knee pain     Past Surgical History:  Procedure Laterality Date  . APPENDECTOMY    . CATARACT EXTRACTION Bilateral 2009  . COLONOSCOPY  2011   @ [facility]  . ILEOSTOMY  2002  . LAPAROTOMY N/A 06/04/2013   Procedure: EXPLORATORY LAPAROTOMY;  Surgeon: Jamesetta So, MD;  Location: [facility];  Service: General;  Laterality: N/A;   parastomal hernia repair  . LYSIS OF ADHESION N/A 06/04/2013   Procedure: LYSIS OF ADHESION;  Surgeon: Jamesetta So, MD;  Location: [facility];  Service: General;  Laterality: N/A;  . NASAL SINUS SURGERY    . SUBTOTAL COLECTOMY  2002  . TUBAL LIGATION      Allergies  Allergen Reactions  . Penicillins     rash  . Tape     Current Outpatient Prescriptions on File Prior to Visit  Medication Sig Dispense Refill  . amLODipine (NORVASC) 5 MG tablet Take 5 mg by mouth daily.    . Multiple Vitamin (MULTIVITAMIN) tablet Take 1 tablet by mouth every morning.     . olmesartan-hydrochlorothiazide (BENICAR HCT) 40-12.5 MG tablet Take 1 tablet by mouth daily.     No current facility-administered medications on file prior to visit.        Objective:   Physical Exam Blood pressure (!) 144/50, pulse 64, temperature 97.1 F (36.2 C), height 5' 2"  (1.575 m), weight 157 lb 6.4 oz (71.4 kg). Alert and  oriented. Skin warm and dry. Oral mucosa is moist.   . Sclera anicteric, conjunctivae is pink. Thyroid not enlarged. No cervical lymphadenopathy. Lungs clear. Heart regular rate and rhythm.  Abdomen is soft. Bowel sounds are positive. No hepatomegaly. No abdominal masses felt. No tenderness.  No edema to lower extremities.          Assessment & Plan:   #1. Crohn's disease. She has remained in remission .  She had flexible sigmoidoscopy in December 2012.

## 2017-04-20 NOTE — Patient Instructions (Signed)
OV in 1 year.  

## 2017-04-22 ENCOUNTER — Encounter (INDEPENDENT_AMBULATORY_CARE_PROVIDER_SITE_OTHER): Payer: Self-pay

## 2017-05-24 DIAGNOSIS — K8051 Calculus of bile duct without cholangitis or cholecystitis with obstruction: Secondary | ICD-10-CM | POA: Diagnosis not present

## 2017-05-24 DIAGNOSIS — Z933 Colostomy status: Secondary | ICD-10-CM | POA: Diagnosis not present

## 2017-05-24 DIAGNOSIS — Z932 Ileostomy status: Secondary | ICD-10-CM | POA: Diagnosis not present

## 2017-07-18 DIAGNOSIS — Z933 Colostomy status: Secondary | ICD-10-CM | POA: Diagnosis not present

## 2017-07-18 DIAGNOSIS — K8051 Calculus of bile duct without cholangitis or cholecystitis with obstruction: Secondary | ICD-10-CM | POA: Diagnosis not present

## 2017-07-18 DIAGNOSIS — Z932 Ileostomy status: Secondary | ICD-10-CM | POA: Diagnosis not present

## 2017-07-28 DIAGNOSIS — K8051 Calculus of bile duct without cholangitis or cholecystitis with obstruction: Secondary | ICD-10-CM | POA: Diagnosis not present

## 2017-07-28 DIAGNOSIS — Z933 Colostomy status: Secondary | ICD-10-CM | POA: Diagnosis not present

## 2017-07-28 DIAGNOSIS — Z932 Ileostomy status: Secondary | ICD-10-CM | POA: Diagnosis not present

## 2017-08-02 DIAGNOSIS — Z933 Colostomy status: Secondary | ICD-10-CM | POA: Diagnosis not present

## 2017-08-02 DIAGNOSIS — Z932 Ileostomy status: Secondary | ICD-10-CM | POA: Diagnosis not present

## 2017-08-02 DIAGNOSIS — K8051 Calculus of bile duct without cholangitis or cholecystitis with obstruction: Secondary | ICD-10-CM | POA: Diagnosis not present

## 2017-08-31 DIAGNOSIS — Z933 Colostomy status: Secondary | ICD-10-CM | POA: Diagnosis not present

## 2017-08-31 DIAGNOSIS — K8051 Calculus of bile duct without cholangitis or cholecystitis with obstruction: Secondary | ICD-10-CM | POA: Diagnosis not present

## 2017-08-31 DIAGNOSIS — Z932 Ileostomy status: Secondary | ICD-10-CM | POA: Diagnosis not present

## 2017-10-03 DIAGNOSIS — R7301 Impaired fasting glucose: Secondary | ICD-10-CM | POA: Diagnosis not present

## 2017-10-03 DIAGNOSIS — E782 Mixed hyperlipidemia: Secondary | ICD-10-CM | POA: Diagnosis not present

## 2017-10-03 DIAGNOSIS — K8051 Calculus of bile duct without cholangitis or cholecystitis with obstruction: Secondary | ICD-10-CM | POA: Diagnosis not present

## 2017-10-03 DIAGNOSIS — Z933 Colostomy status: Secondary | ICD-10-CM | POA: Diagnosis not present

## 2017-10-03 DIAGNOSIS — Z932 Ileostomy status: Secondary | ICD-10-CM | POA: Diagnosis not present

## 2017-10-03 DIAGNOSIS — E039 Hypothyroidism, unspecified: Secondary | ICD-10-CM | POA: Diagnosis not present

## 2017-10-03 DIAGNOSIS — I10 Essential (primary) hypertension: Secondary | ICD-10-CM | POA: Diagnosis not present

## 2017-10-06 DIAGNOSIS — Z933 Colostomy status: Secondary | ICD-10-CM | POA: Diagnosis not present

## 2017-10-06 DIAGNOSIS — E039 Hypothyroidism, unspecified: Secondary | ICD-10-CM | POA: Diagnosis not present

## 2017-10-06 DIAGNOSIS — I10 Essential (primary) hypertension: Secondary | ICD-10-CM | POA: Diagnosis not present

## 2017-10-06 DIAGNOSIS — R7301 Impaired fasting glucose: Secondary | ICD-10-CM | POA: Diagnosis not present

## 2017-10-06 DIAGNOSIS — E782 Mixed hyperlipidemia: Secondary | ICD-10-CM | POA: Diagnosis not present

## 2017-10-06 DIAGNOSIS — M1711 Unilateral primary osteoarthritis, right knee: Secondary | ICD-10-CM | POA: Diagnosis not present

## 2017-10-06 DIAGNOSIS — Z6828 Body mass index (BMI) 28.0-28.9, adult: Secondary | ICD-10-CM | POA: Diagnosis not present

## 2017-10-06 DIAGNOSIS — K5090 Crohn's disease, unspecified, without complications: Secondary | ICD-10-CM | POA: Diagnosis not present

## 2017-10-06 DIAGNOSIS — R809 Proteinuria, unspecified: Secondary | ICD-10-CM | POA: Diagnosis not present

## 2017-10-06 DIAGNOSIS — F411 Generalized anxiety disorder: Secondary | ICD-10-CM | POA: Diagnosis not present

## 2017-11-07 DIAGNOSIS — Z933 Colostomy status: Secondary | ICD-10-CM | POA: Diagnosis not present

## 2017-11-07 DIAGNOSIS — Z932 Ileostomy status: Secondary | ICD-10-CM | POA: Diagnosis not present

## 2017-11-07 DIAGNOSIS — K8051 Calculus of bile duct without cholangitis or cholecystitis with obstruction: Secondary | ICD-10-CM | POA: Diagnosis not present

## 2017-12-19 DIAGNOSIS — Z932 Ileostomy status: Secondary | ICD-10-CM | POA: Diagnosis not present

## 2017-12-19 DIAGNOSIS — Z933 Colostomy status: Secondary | ICD-10-CM | POA: Diagnosis not present

## 2017-12-19 DIAGNOSIS — K8051 Calculus of bile duct without cholangitis or cholecystitis with obstruction: Secondary | ICD-10-CM | POA: Diagnosis not present

## 2018-02-06 DIAGNOSIS — R600 Localized edema: Secondary | ICD-10-CM | POA: Diagnosis not present

## 2018-02-06 DIAGNOSIS — R2242 Localized swelling, mass and lump, left lower limb: Secondary | ICD-10-CM | POA: Diagnosis not present

## 2018-02-13 ENCOUNTER — Telehealth (INDEPENDENT_AMBULATORY_CARE_PROVIDER_SITE_OTHER): Payer: Self-pay | Admitting: *Deleted

## 2018-02-13 DIAGNOSIS — R7301 Impaired fasting glucose: Secondary | ICD-10-CM | POA: Diagnosis not present

## 2018-02-13 DIAGNOSIS — F411 Generalized anxiety disorder: Secondary | ICD-10-CM | POA: Diagnosis not present

## 2018-02-13 DIAGNOSIS — Z6828 Body mass index (BMI) 28.0-28.9, adult: Secondary | ICD-10-CM | POA: Diagnosis not present

## 2018-02-13 DIAGNOSIS — R809 Proteinuria, unspecified: Secondary | ICD-10-CM | POA: Diagnosis not present

## 2018-02-13 DIAGNOSIS — E782 Mixed hyperlipidemia: Secondary | ICD-10-CM | POA: Diagnosis not present

## 2018-02-13 DIAGNOSIS — M25572 Pain in left ankle and joints of left foot: Secondary | ICD-10-CM | POA: Diagnosis not present

## 2018-02-13 DIAGNOSIS — Z933 Colostomy status: Secondary | ICD-10-CM | POA: Diagnosis not present

## 2018-02-13 DIAGNOSIS — K5090 Crohn's disease, unspecified, without complications: Secondary | ICD-10-CM | POA: Diagnosis not present

## 2018-02-13 DIAGNOSIS — I10 Essential (primary) hypertension: Secondary | ICD-10-CM | POA: Diagnosis not present

## 2018-02-13 DIAGNOSIS — E039 Hypothyroidism, unspecified: Secondary | ICD-10-CM | POA: Diagnosis not present

## 2018-02-13 DIAGNOSIS — R2242 Localized swelling, mass and lump, left lower limb: Secondary | ICD-10-CM | POA: Diagnosis not present

## 2018-02-13 DIAGNOSIS — M1711 Unilateral primary osteoarthritis, right knee: Secondary | ICD-10-CM | POA: Diagnosis not present

## 2018-02-13 NOTE — Telephone Encounter (Signed)
Patient called in and left a message on receptionist voicemail. She states that she has a Colostomy bag and is bleeding wants to know if she needs to go to the ED. Appears that she last saw Terri  2018. Forwarded to Terri to review and call patient to advise. [phone].

## 2018-02-13 NOTE — Telephone Encounter (Signed)
If she has blood in the bag, she probably needs to go.

## 2018-02-13 NOTE — Telephone Encounter (Signed)
noted 

## 2018-02-13 NOTE — Telephone Encounter (Signed)
I called the patient, she states that there is no blood in the bag , she states that she notices when she wipes that there is a bright red blood on the tissue. C/O of no burning upon urination, she is urinating a lot but feels that this is related to her drinking a lot of [employer] since last Tuesday, and taking [employer] pills, lower back pain upon standing. A febrile.  I suggested that she call her PCP and be evaluated for a possible UTI. She will call our office back with the findings.

## 2018-02-14 DIAGNOSIS — R2242 Localized swelling, mass and lump, left lower limb: Secondary | ICD-10-CM | POA: Diagnosis not present

## 2018-02-14 DIAGNOSIS — M25572 Pain in left ankle and joints of left foot: Secondary | ICD-10-CM | POA: Diagnosis not present

## 2018-02-14 DIAGNOSIS — K5090 Crohn's disease, unspecified, without complications: Secondary | ICD-10-CM | POA: Diagnosis not present

## 2018-02-14 DIAGNOSIS — E782 Mixed hyperlipidemia: Secondary | ICD-10-CM | POA: Diagnosis not present

## 2018-02-14 DIAGNOSIS — I10 Essential (primary) hypertension: Secondary | ICD-10-CM | POA: Diagnosis not present

## 2018-02-14 DIAGNOSIS — E039 Hypothyroidism, unspecified: Secondary | ICD-10-CM | POA: Diagnosis not present

## 2018-02-14 DIAGNOSIS — F411 Generalized anxiety disorder: Secondary | ICD-10-CM | POA: Diagnosis not present

## 2018-02-14 DIAGNOSIS — Z6828 Body mass index (BMI) 28.0-28.9, adult: Secondary | ICD-10-CM | POA: Diagnosis not present

## 2018-02-14 DIAGNOSIS — R809 Proteinuria, unspecified: Secondary | ICD-10-CM | POA: Diagnosis not present

## 2018-02-14 DIAGNOSIS — Z933 Colostomy status: Secondary | ICD-10-CM | POA: Diagnosis not present

## 2018-02-14 DIAGNOSIS — R7301 Impaired fasting glucose: Secondary | ICD-10-CM | POA: Diagnosis not present

## 2018-02-14 DIAGNOSIS — M1711 Unilateral primary osteoarthritis, right knee: Secondary | ICD-10-CM | POA: Diagnosis not present

## 2018-02-22 ENCOUNTER — Encounter (HOSPITAL_COMMUNITY)
Admission: RE | Admit: 2018-02-22 | Discharge: 2018-02-22 | Disposition: A | Discharge status: Home / Self Care | Payer: PPO | Source: Ambulatory Visit | Attending: Obstetrics and Gynecology | Admitting: Obstetrics and Gynecology

## 2018-02-22 ENCOUNTER — Encounter (HOSPITAL_COMMUNITY): Payer: Self-pay

## 2018-02-22 ENCOUNTER — Encounter: Payer: Self-pay | Admitting: Obstetrics and Gynecology

## 2018-02-22 ENCOUNTER — Encounter (INDEPENDENT_AMBULATORY_CARE_PROVIDER_SITE_OTHER): Payer: Self-pay

## 2018-02-22 ENCOUNTER — Ambulatory Visit: Payer: PPO | Admitting: Obstetrics and Gynecology

## 2018-02-22 ENCOUNTER — Other Ambulatory Visit: Payer: Self-pay | Admitting: Obstetrics and Gynecology

## 2018-02-22 ENCOUNTER — Other Ambulatory Visit: Payer: Self-pay

## 2018-02-22 VITALS — BP 164/60 | HR 80 | Ht 62.0 in | Wt 160.8 lb

## 2018-02-22 DIAGNOSIS — R531 Weakness: Secondary | ICD-10-CM

## 2018-02-22 DIAGNOSIS — C541 Malignant neoplasm of endometrium: Secondary | ICD-10-CM | POA: Diagnosis not present

## 2018-02-22 DIAGNOSIS — F411 Generalized anxiety disorder: Secondary | ICD-10-CM | POA: Diagnosis not present

## 2018-02-22 DIAGNOSIS — Z9841 Cataract extraction status, right eye: Secondary | ICD-10-CM | POA: Diagnosis not present

## 2018-02-22 DIAGNOSIS — E039 Hypothyroidism, unspecified: Secondary | ICD-10-CM | POA: Diagnosis not present

## 2018-02-22 DIAGNOSIS — N939 Abnormal uterine and vaginal bleeding, unspecified: Secondary | ICD-10-CM

## 2018-02-22 DIAGNOSIS — Z933 Colostomy status: Secondary | ICD-10-CM | POA: Diagnosis not present

## 2018-02-22 DIAGNOSIS — M545 Low back pain: Secondary | ICD-10-CM | POA: Diagnosis not present

## 2018-02-22 DIAGNOSIS — Z79899 Other long term (current) drug therapy: Secondary | ICD-10-CM | POA: Diagnosis not present

## 2018-02-22 DIAGNOSIS — Z91048 Other nonmedicinal substance allergy status: Secondary | ICD-10-CM | POA: Diagnosis not present

## 2018-02-22 DIAGNOSIS — I10 Essential (primary) hypertension: Secondary | ICD-10-CM | POA: Diagnosis not present

## 2018-02-22 DIAGNOSIS — Z7982 Long term (current) use of aspirin: Secondary | ICD-10-CM | POA: Diagnosis not present

## 2018-02-22 DIAGNOSIS — M25572 Pain in left ankle and joints of left foot: Secondary | ICD-10-CM | POA: Diagnosis not present

## 2018-02-22 DIAGNOSIS — M25561 Pain in right knee: Secondary | ICD-10-CM | POA: Diagnosis not present

## 2018-02-22 DIAGNOSIS — K5090 Crohn's disease, unspecified, without complications: Secondary | ICD-10-CM | POA: Diagnosis not present

## 2018-02-22 DIAGNOSIS — M62830 Muscle spasm of back: Secondary | ICD-10-CM | POA: Diagnosis not present

## 2018-02-22 DIAGNOSIS — R011 Cardiac murmur, unspecified: Secondary | ICD-10-CM | POA: Diagnosis not present

## 2018-02-22 DIAGNOSIS — R2242 Localized swelling, mass and lump, left lower limb: Secondary | ICD-10-CM | POA: Diagnosis not present

## 2018-02-22 DIAGNOSIS — M1711 Unilateral primary osteoarthritis, right knee: Secondary | ICD-10-CM | POA: Diagnosis not present

## 2018-02-22 DIAGNOSIS — N840 Polyp of corpus uteri: Secondary | ICD-10-CM | POA: Diagnosis not present

## 2018-02-22 DIAGNOSIS — R7301 Impaired fasting glucose: Secondary | ICD-10-CM | POA: Diagnosis not present

## 2018-02-22 DIAGNOSIS — M1990 Unspecified osteoarthritis, unspecified site: Secondary | ICD-10-CM | POA: Diagnosis not present

## 2018-02-22 DIAGNOSIS — E782 Mixed hyperlipidemia: Secondary | ICD-10-CM | POA: Diagnosis not present

## 2018-02-22 DIAGNOSIS — N950 Postmenopausal bleeding: Secondary | ICD-10-CM | POA: Diagnosis not present

## 2018-02-22 DIAGNOSIS — Z6828 Body mass index (BMI) 28.0-28.9, adult: Secondary | ICD-10-CM | POA: Diagnosis not present

## 2018-02-22 DIAGNOSIS — Z9842 Cataract extraction status, left eye: Secondary | ICD-10-CM | POA: Diagnosis not present

## 2018-02-22 DIAGNOSIS — N390 Urinary tract infection, site not specified: Secondary | ICD-10-CM | POA: Diagnosis not present

## 2018-02-22 DIAGNOSIS — R809 Proteinuria, unspecified: Secondary | ICD-10-CM | POA: Diagnosis not present

## 2018-02-22 DIAGNOSIS — Z9049 Acquired absence of other specified parts of digestive tract: Secondary | ICD-10-CM | POA: Diagnosis not present

## 2018-02-22 DIAGNOSIS — Z880 Allergy status to penicillin: Secondary | ICD-10-CM | POA: Diagnosis not present

## 2018-02-22 LAB — URINALYSIS, ROUTINE W REFLEX MICROSCOPIC
BILIRUBIN URINE: NEGATIVE
GLUCOSE, UA: NEGATIVE mg/dL
KETONES UR: NEGATIVE mg/dL
NITRITE: NEGATIVE
Protein, ur: 100 mg/dL — AB
Specific Gravity, Urine: 1.012 (ref 1.005–1.030)
Squamous Epithelial / LPF: NONE SEEN
pH: 6 (ref 5.0–8.0)

## 2018-02-22 LAB — COMPREHENSIVE METABOLIC PANEL
ALK PHOS: 74 U/L (ref 38–126)
ALT: 20 U/L (ref 14–54)
ANION GAP: 10 (ref 5–15)
AST: 14 U/L — ABNORMAL LOW (ref 15–41)
Albumin: 3.2 g/dL — ABNORMAL LOW (ref 3.5–5.0)
BILIRUBIN TOTAL: 0.5 mg/dL (ref 0.3–1.2)
BUN: 19 mg/dL (ref 6–20)
CALCIUM: 9 mg/dL (ref 8.9–10.3)
CO2: 20 mmol/L — ABNORMAL LOW (ref 22–32)
Chloride: 93 mmol/L — ABNORMAL LOW (ref 101–111)
Creatinine, Ser: 0.78 mg/dL (ref 0.44–1.00)
GFR calc non Af Amer: >60 mL/min (ref >60)
Glucose, Bld: 132 mg/dL — ABNORMAL HIGH (ref 65–99)
Potassium: 4.6 mmol/L (ref 3.5–5.1)
Sodium: 123 mmol/L — ABNORMAL LOW (ref 135–145)
TOTAL PROTEIN: 7.1 g/dL (ref 6.5–8.1)

## 2018-02-22 LAB — CBC
HEMATOCRIT: 29.8 % — AB (ref 36.0–46.0)
HEMOGLOBIN: 10.1 g/dL — AB (ref 12.0–15.0)
MCH: 28.4 pg (ref 26.0–34.0)
MCHC: 33.9 g/dL (ref 30.0–36.0)
MCV: 83.7 fL (ref 78.0–100.0)
Platelets: 350 10*3/uL (ref 150–400)
RBC: 3.56 MIL/uL — ABNORMAL LOW (ref 3.87–5.11)
RDW: 12.9 % (ref 11.5–15.5)
WBC: 12.1 10*3/uL — ABNORMAL HIGH (ref 4.0–10.5)

## 2018-02-22 MED ORDER — CIPROFLOXACIN HCL 500 MG PO TABS: 500.0000 mg | ORAL_TABLET | Freq: Two times a day (BID) | ORAL | 0 refills | Status: DC

## 2018-02-22 NOTE — Patient Instructions (Signed)
Nicole Cochran  02/22/2018     @PREFPERIOPPHARMACY @   Your procedure is scheduled on  02/23/2018  Report to [facility] at  645   A.M.  Call this number if you have problems the morning of surgery:  [phone]   Remember:  Do not eat food or drink liquids after midnight.  Take these medicines the morning of surgery with A SIP OF WATER  Norvasc, benicar.   Do not wear jewelry, make-up or nail polish.  Do not wear lotions, powders, or perfumes, or deodorant.  Do not shave 48 hours prior to surgery.  Men may shave face and neck.  Do not bring valuables to the hospital.  [facility] is not responsible for any belongings or valuables.  Contacts, dentures or bridgework may not be worn into surgery.  Leave your suitcase in the car.  After surgery it may be brought to your room.  For patients admitted to the hospital, discharge time will be determined by your treatment team.  Patients discharged the day of surgery will not be allowed to drive home.   Name and phone number of your driver:   Family Special instructions:  None  Please read over the following fact sheets that you were given. Anesthesia Post-op Instructions and Care and Recovery After Surgery       Dilation and Curettage or Vacuum Curettage Dilation and curettage (D&C) and vacuum curettage are minor procedures. A D&C involves stretching (dilation) the cervix and scraping (curettage) the inside lining of the uterus (endometrium). During a D&C, tissue is gently scraped from the endometrium, starting from the top portion of the uterus down to the lowest part of the uterus (cervix). During a vacuum curettage, the lining and tissue in the uterus are removed with the use of gentle suction. Curettage may be performed to either diagnose or treat a problem. As a diagnostic procedure, curettage is performed to examine tissues from the uterus. A diagnostic curettage may be done if you have:  Irregular bleeding  in the uterus.  Bleeding with the development of clots.  Spotting between menstrual periods.  Prolonged menstrual periods or other abnormal bleeding.  Bleeding after menopause.  No menstrual period (amenorrhea).  A change in size and shape of the uterus.  Abnormal endometrial cells discovered during a Pap test.  As a treatment procedure, curettage may be performed for the following reasons:  Removal of an IUD (intrauterine device).  Removal of retained placenta after giving birth.  Abortion.  Miscarriage.  Removal of endometrial polyps.  Removal of uncommon types of noncancerous lumps (fibroids).  Tell a health care provider about:  Any allergies you have, including allergies to prescribed medicine or latex.  All medicines you are taking, including vitamins, herbs, eye drops, creams, and over-the-counter medicines. This is especially important if you take any blood-thinning medicine. Bring a list of all of your medicines to your appointment.  Any problems you or family members have had with anesthetic medicines.  Any blood disorders you have.  Any surgeries you have had.  Your medical history and any medical conditions you have.  Whether you are pregnant or may be pregnant.  Recent vaginal infections you have had.  Recent menstrual periods, bleeding problems you have had, and what form of birth control (contraception) you use. What are the risks? Generally, this is a safe procedure. However, problems may occur, including:  Infection.  Heavy vaginal bleeding.  Allergic reactions to medicines.  Damage to the cervix or other structures or organs.  Development of scar tissue (adhesions) inside the uterus, which can cause abnormal amounts of menstrual bleeding. This may make it harder to get pregnant in the future.  A hole (perforation) or puncture in the uterine wall. This is rare.  What happens before the procedure? Staying hydrated Follow instructions  from your health care provider about hydration, which may include:  Up to 2 hours before the procedure - you may continue to drink clear liquids, such as water, clear fruit juice, black coffee, and plain tea.  Eating and drinking restrictions Follow instructions from your health care provider about eating and drinking, which may include:  8 hours before the procedure - stop eating heavy meals or foods such as meat, fried foods, or fatty foods.  6 hours before the procedure - stop eating light meals or foods, such as toast or cereal.  6 hours before the procedure - stop drinking milk or drinks that contain milk.  2 hours before the procedure - stop drinking clear liquids. If your health care provider told you to take your medicine(s) on the day of your procedure, take them with only a sip of water.  Medicines  Ask your health care provider about: ? Changing or stopping your regular medicines. This is especially important if you are taking diabetes medicines or blood thinners. ? Taking medicines such as aspirin and ibuprofen. These medicines can thin your blood. Do not take these medicines before your procedure if your health care provider instructs you not to.  You may be given antibiotic medicine to help prevent infection. General instructions  For 24 hours before your procedure, do not: ? Douche. ? Use tampons. ? Use medicines, creams, or suppositories in the vagina. ? Have sexual intercourse.  You may be given a pregnancy test on the day of the procedure.  Plan to have someone take you home from the hospital or clinic.  You may have a blood or urine sample taken.  If you will be going home right after the procedure, plan to have someone with you for 24 hours. What happens during the procedure?  To reduce your risk of infection: ? Your health care team will wash or sanitize their hands. ? Your skin will be washed with soap.  An IV tube will be inserted into one of your  veins.  You will be given one of the following: ? A medicine that numbs the area in and around the cervix (local anesthetic). ? A medicine to make you fall asleep (general anesthetic).  You will lie down on your back, with your feet in foot rests (stirrups).  The size and position of your uterus will be checked.  A lubricated instrument (speculum or Sims retractor) will be inserted into the back side of your vagina. The speculum will be used to hold apart the walls of your vagina so your health care provider can see your cervix.  A tool (tenaculum) will be attached to the lip of the cervix to stabilize it.  Your cervix will be softened and dilated. This may be done by: ? Taking a medicine. ? Having tapered dilators or thin rods (laminaria) or gradual widening instruments (tapered dilators) inserted into your cervix.  A small, sharp, curved instrument (curette) will be used to scrape a small amount of tissue or cells from the endometrium or cervical canal. In some cases, gentle suction is applied with the curette. The curette  will then be removed. The cells will be taken to a lab for testing. The procedure may vary among health care providers and hospitals. What happens after the procedure?  You may have mild cramping, backache, pain, and light bleeding or spotting. You may pass small blood clots from your vagina.  You may have to wear compression stockings. These stockings help to prevent blood clots and reduce swelling in your legs.  Your blood pressure, heart rate, breathing rate, and blood oxygen level will be monitored until the medicines you were given have worn off. Summary  Dilation and curettage (D&C) involves stretching (dilation) the cervix and scraping (curettage) the inside lining of the uterus (endometrium).  After the procedure, you may have mild cramping, backache, pain, and light bleeding or spotting. You may pass small blood clots from your vagina.  Plan to have  someone take you home from the hospital or clinic. This information is not intended to replace advice given to you by your health care provider. Make sure you discuss any questions you have with your health care provider. Document Released: 11/15/2005 Document Revised: 08/01/2016 Document Reviewed: 08/01/2016 Elsevier Interactive Patient Education  2018 [employer].  Dilation and Curettage or Vacuum Curettage, Care After These instructions give you information about caring for yourself after your procedure. Your doctor may also give you more specific instructions. Call your doctor if you have any problems or questions after your procedure. Follow these instructions at home: Activity  Do not drive or use heavy machinery while taking prescription pain medicine.  For 24 hours after your procedure, avoid driving.  Take short walks often, followed by rest periods. Ask your doctor what activities are safe for you. After one or two days, you may be able to return to your normal activities.  Do not lift anything that is heavier than 10 lb (4.5 kg) until your doctor approves.  For at least 2 weeks, or as long as told by your doctor: ? Do not douche. ? Do not use tampons. ? Do not have sex. General instructions  Take over-the-counter and prescription medicines only as told by your doctor. This is very important if you take blood thinning medicine.  Do not take baths, swim, or use a hot tub until your doctor approves. Take showers instead of baths.  Wear compression stockings as told by your doctor.  It is up to you to get the results of your procedure. Ask your doctor when your results will be ready.  Keep all follow-up visits as told by your doctor. This is important. Contact a doctor if:  You have very bad cramps that get worse or do not get better with medicine.  You have very bad pain in your belly (abdomen).  You cannot drink fluids without throwing up (vomiting).  You get pain  in a different part of the area between your belly and thighs (pelvis).  You have bad-smelling discharge from your vagina.  You have a rash. Get help right away if:  You are bleeding a lot from your vagina. A lot of bleeding means soaking more than one sanitary pad in an hour, for 2 hours in a row.  You have clumps of blood (blood clots) coming from your vagina.  You have a fever or chills.  Your belly feels very tender or hard.  You have chest pain.  You have trouble breathing.  You cough up blood.  You feel dizzy.  You feel light-headed.  You pass out (faint).  You  have pain in your neck or shoulder area. Summary  Take short walks often, followed by rest periods. Ask your doctor what activities are safe for you. After one or two days, you may be able to return to your normal activities.  Do not lift anything that is heavier than 10 lb (4.5 kg) until your doctor approves.  Do not take baths, swim, or use a hot tub until your doctor approves. Take showers instead of baths.  Contact your doctor if you have any symptoms of infection, like bad-smelling discharge from your vagina. This information is not intended to replace advice given to you by your health care provider. Make sure you discuss any questions you have with your health care provider. Document Released: 08/24/2008 Document Revised: 08/02/2016 Document Reviewed: 08/02/2016 Elsevier Interactive Patient Education  2017 [place]. Hysteroscopy Hysteroscopy is a procedure used for looking inside the womb (uterus). It may be done for various reasons, including:  To evaluate abnormal bleeding, fibroid (benign, noncancerous) tumors, polyps, scar tissue (adhesions), and possibly cancer of the uterus.  To look for lumps (tumors) and other uterine growths.  To look for causes of why a woman cannot get pregnant (infertility), causes of recurrent loss of pregnancy (miscarriages), or a lost intrauterine device  (IUD).  To perform a sterilization by blocking the fallopian tubes from inside the uterus.  In this procedure, a thin, flexible tube with a tiny light and camera on the end of it (hysteroscope) is used to look inside the uterus. A hysteroscopy should be done right after a menstrual period to be sure you are not pregnant. LET [facility] CARE PROVIDER KNOW ABOUT:  Any allergies you have.  All medicines you are taking, including vitamins, herbs, eye drops, creams, and over-the-counter medicines.  Previous problems you or members of your family have had with the use of anesthetics.  Any blood disorders you have.  Previous surgeries you have had.  Medical conditions you have. RISKS AND COMPLICATIONS Generally, this is a safe procedure. However, as with any procedure, complications can occur. Possible complications include:  Putting a hole in the uterus.  Excessive bleeding.  Infection.  Damage to the cervix.  Injury to other organs.  Allergic reaction to medicines.  Too much fluid used in the uterus for the procedure.  BEFORE THE PROCEDURE  Ask your health care provider about changing or stopping any regular medicines.  Do not take aspirin or blood thinners for 1 week before the procedure, or as directed by your health care provider. These can cause bleeding.  If you smoke, do not smoke for 2 weeks before the procedure.  In some cases, a medicine is placed in the cervix the day before the procedure. This medicine makes the cervix have a larger opening (dilate). This makes it easier for the instrument to be inserted into the uterus during the procedure.  Do not eat or drink anything for at least 8 hours before the surgery.  Arrange for someone to take you home after the procedure. PROCEDURE  You may be given a medicine to relax you (sedative). You may also be given one of the following: ? A medicine that numbs the area around the cervix (local anesthetic). ? A medicine  that makes you sleep through the procedure (general anesthetic).  The hysteroscope is inserted through the vagina into the uterus. The camera on the hysteroscope sends a picture to a TV screen. This gives the surgeon a good view inside the uterus.  During the  procedure, air or a liquid is put into the uterus, which allows the surgeon to see better.  Sometimes, tissue is gently scraped from inside the uterus. These tissue samples are sent to a lab for testing. What to expect after the procedure  If you had a general anesthetic, you may be groggy for a couple hours after the procedure.  If you had a local anesthetic, you will be able to go home as soon as you are stable and feel ready.  You may have some cramping. This normally lasts for a couple days.  You may have bleeding, which varies from light spotting for a few days to menstrual-like bleeding for 3-7 days. This is normal.  If your test results are not back during the visit, make an appointment with your health care provider to find out the results. This information is not intended to replace advice given to you by your health care provider. Make sure you discuss any questions you have with your health care provider. Document Released: 02/21/2001 Document Revised: 04/22/2016 Document Reviewed: 06/14/2013 Elsevier Interactive Patient Education  2017 [place]. Hysteroscopy, Care After Refer to this sheet in the next few weeks. These instructions provide you with information on caring for yourself after your procedure. Your health care provider may also give you more specific instructions. Your treatment has been planned according to current medical practices, but problems sometimes occur. Call your health care provider if you have any problems or questions after your procedure. What can I expect after the procedure? After your procedure, it is typical to have the following:  You may have some cramping. This normally lasts for a  couple days.  You may have bleeding. This can vary from light spotting for a few days to menstrual-like bleeding for 3-7 days.  Follow these instructions at home:  Rest for the first 1-2 days after the procedure.  Only take over-the-counter or prescription medicines as directed by your health care provider. Do not take aspirin. It can increase the chances of bleeding.  Take showers instead of baths for 2 weeks or as directed by your health care provider.  Do not drive for 24 hours or as directed.  Do not drink alcohol while taking pain medicine.  Do not use tampons, douche, or have sexual intercourse for 2 weeks or until your health care provider says it is okay.  Take your temperature twice a day for 4-5 days. Write it down each time.  Follow your health care provider's advice about diet, exercise, and lifting.  If you develop constipation, you may: ? Take a mild laxative if your health care provider approves. ? Add bran foods to your diet. ? Drink enough fluids to keep your urine clear or pale yellow.  Try to have someone with you or available to you for the first 24-48 hours, especially if you were given a general anesthetic.  Follow up with your health care provider as directed. Contact a health care provider if:  You feel dizzy or lightheaded.  You feel sick to your stomach (nauseous).  You have abnormal vaginal discharge.  You have a rash.  You have pain that is not controlled with medicine. Get help right away if:  You have bleeding that is heavier than a normal menstrual period.  You have a fever.  You have increasing cramps or pain, not controlled with medicine.  You have new belly (abdominal) pain.  You pass out.  You have pain in the tops of your shoulders (  shoulder strap areas).  You have shortness of breath. This information is not intended to replace advice given to you by your health care provider. Make sure you discuss any questions you have  with your health care provider. Document Released: 09/05/2013 Document Revised: 04/22/2016 Document Reviewed: 06/14/2013 Elsevier Interactive Patient Education  2017 [place] Anesthesia, Adult General anesthesia is the use of medicines to make a person "go to sleep" (be unconscious) for a medical procedure. General anesthesia is often recommended when a procedure:  Is long.  Requires you to be still or in an unusual position.  Is major and can cause you to lose blood.  Is impossible to do without general anesthesia.  The medicines used for general anesthesia are called general anesthetics. In addition to making you sleep, the medicines:  Prevent pain.  Control your blood pressure.  Relax your muscles.  Tell a health care provider about:  Any allergies you have.  All medicines you are taking, including vitamins, herbs, eye drops, creams, and over-the-counter medicines.  Any problems you or family members have had with anesthetic medicines.  Types of anesthetics you have had in the past.  Any bleeding disorders you have.  Any surgeries you have had.  Any medical conditions you have.  Any history of heart or lung conditions, such as heart failure, sleep apnea, or chronic obstructive pulmonary disease (COPD).  Whether you are pregnant or may be pregnant.  Whether you use tobacco, alcohol, marijuana, or street drugs.  Any history of Armed forces logistics/support/administrative officer.  Any history of depression or anxiety. What are the risks? Generally, this is a safe procedure. However, problems may occur, including:  Allergic reaction to anesthetics.  Lung and heart problems.  Inhaling food or liquids from your stomach into your lungs (aspiration).  Injury to nerves.  Waking up during your procedure and being unable to move (rare).  Extreme agitation or a state of mental confusion (delirium) when you wake up from the anesthetic.  Air in the bloodstream, which can lead to  stroke.  These problems are more likely to develop if you are having a major surgery or if you have an advanced medical condition. You can prevent some of these complications by answering all of your health care provider's questions thoroughly and by following all pre-procedure instructions. General anesthesia can cause side effects, including:  Nausea or vomiting  A sore throat from the breathing tube.  Feeling cold or shivery.  Feeling tired, washed out, or achy.  Sleepiness or drowsiness.  Confusion or agitation.  What happens before the procedure? Staying hydrated Follow instructions from your health care provider about hydration, which may include:  Up to 2 hours before the procedure - you may continue to drink clear liquids, such as water, clear fruit juice, black coffee, and plain tea.  Eating and drinking restrictions Follow instructions from your health care provider about eating and drinking, which may include:  8 hours before the procedure - stop eating heavy meals or foods such as meat, fried foods, or fatty foods.  6 hours before the procedure - stop eating light meals or foods, such as toast or cereal.  6 hours before the procedure - stop drinking milk or drinks that contain milk.  2 hours before the procedure - stop drinking clear liquids.  Medicines  Ask your health care provider about: ? Changing or stopping your regular medicines. This is especially important if you are taking diabetes medicines or blood thinners. ? Taking medicines such  as aspirin and ibuprofen. These medicines can thin your blood. Do not take these medicines before your procedure if your health care provider instructs you not to. ? Taking new dietary supplements or medicines. Do not take these during the week before your procedure unless your health care provider approves them.  If you are told to take a medicine or to continue taking a medicine on the day of the procedure, take the  medicine with sips of water. General instructions   Ask if you will be going home the same day, the following day, or after a longer hospital stay. ? Plan to have someone take you home. ? Plan to have someone stay with you for the first 24 hours after you leave the hospital or clinic.  For 3-6 weeks before the procedure, try not to use any tobacco products, such as cigarettes, chewing tobacco, and e-cigarettes.  You may brush your teeth on the morning of the procedure, but make sure to spit out the toothpaste. What happens during the procedure?  You will be given anesthetics through a mask and through an IV tube in one of your veins.  You may receive medicine to help you relax (sedative).  As soon as you are asleep, a breathing tube may be used to help you breathe.  An anesthesia specialist will stay with you throughout the procedure. He or she will help keep you comfortable and safe by continuing to give you medicines and adjusting the amount of medicine that you get. He or she will also watch your blood pressure, pulse, and oxygen levels to make sure that the anesthetics do not cause any problems.  If a breathing tube was used to help you breathe, it will be removed before you wake up. The procedure may vary among health care providers and hospitals. What happens after the procedure?  You will wake up, often slowly, after the procedure is complete, usually in a recovery area.  Your blood pressure, heart rate, breathing rate, and blood oxygen level will be monitored until the medicines you were given have worn off.  You may be given medicine to help you calm down if you feel anxious or agitated.  If you will be going home the same day, your health care provider may check to make sure you can stand, drink, and urinate.  Your health care providers will treat your pain and side effects before you go home.  Do not drive for 24 hours if you received a sedative.  You may: ? Feel  nauseous and vomit. ? Have a sore throat. ? Have mental slowness. ? Feel cold or shivery. ? Feel sleepy. ? Feel tired. ? Feel sore or achy, even in parts of your body where you did not have surgery. This information is not intended to replace advice given to you by your health care provider. Make sure you discuss any questions you have with your health care provider. Document Released: 02/22/2008 Document Revised: 04/27/2016 Document Reviewed: 10/30/2015 Elsevier Interactive Patient Education  2018 [place] Anesthesia, Adult, Care After These instructions provide you with information about caring for yourself after your procedure. Your health care provider may also give you more specific instructions. Your treatment has been planned according to current medical practices, but problems sometimes occur. Call your health care provider if you have any problems or questions after your procedure. What can I expect after the procedure? After the procedure, it is common to have:  Vomiting.  A sore throat.  Mental slowness.  It is common to feel:  Nauseous.  Cold or shivery.  Sleepy.  Tired.  Sore or achy, even in parts of your body where you did not have surgery.  Follow these instructions at home: For at least 24 hours after the procedure:  Do not: ? Participate in activities where you could fall or become injured. ? Drive. ? Use heavy machinery. ? Drink alcohol. ? Take sleeping pills or medicines that cause drowsiness. ? Make important decisions or sign legal documents. ? Take care of children on your own.  Rest. Eating and drinking  If you vomit, drink water, juice, or soup when you can drink without vomiting.  Drink enough fluid to keep your urine clear or pale yellow.  Make sure you have little or no nausea before eating solid foods.  Follow the diet recommended by your health care provider. General instructions  Have a responsible adult stay with  you until you are awake and alert.  Return to your normal activities as told by your health care provider. Ask your health care provider what activities are safe for you.  Take over-the-counter and prescription medicines only as told by your health care provider.  If you smoke, do not smoke without supervision.  Keep all follow-up visits as told by your health care provider. This is important. Contact a health care provider if:  You continue to have nausea or vomiting at home, and medicines are not helpful.  You cannot drink fluids or start eating again.  You cannot urinate after 8-12 hours.  You develop a skin rash.  You have fever.  You have increasing redness at the site of your procedure. Get help right away if:  You have difficulty breathing.  You have chest pain.  You have unexpected bleeding.  You feel that you are having a life-threatening or urgent problem. This information is not intended to replace advice given to you by your health care provider. Make sure you discuss any questions you have with your health care provider. Document Released: 02/21/2001 Document Revised: 04/19/2016 Document Reviewed: 10/30/2015 Elsevier Interactive Patient Education  [employer].

## 2018-02-22 NOTE — Progress Notes (Addendum)
[facility] Visit  02/22/2018            Patient name: Nicole Cochran MRN [id number]  Date of birth: 07-Nov-1935  CC & HPI:  Nicole Cochran is a 83 y.o. female presenting today for vaginal bleeding that began two weeks ago after a fall. She says that when she lays down at night, she feels like she has urinated on herself due to the amount of discharge and bleeding.. She has noted weakness as an associated symptom. Patient has not tried any medications for relied. No alleviating factors noted. She has been having to use pads. She has a colostomy bag, and has noted pain during the night. She recently had a UTI, and has been taking medications for it.   Her doctor told her she might need a D&C or hysterectomy.  ROS:  ROS  (+) abnormal vaginal bleeding (+) colostomy bag (+) weakness (-) chills All systems are negative except as noted in the HPI and PMH.    Pertinent History Reviewed:   Reviewed: Significant for Crohns, tubal ligation Medical         Past Medical History:  Diagnosis Date  . Arthritis   . Crohn's   . Heart murmur   . Hypertension   . Pneumonia    83 years old,  and 2002  . Right knee pain                               Surgical Hx:    Past Surgical History:  Procedure Laterality Date  . APPENDECTOMY    . CATARACT EXTRACTION Bilateral 2009  . COLONOSCOPY  2011   @ [place]  . ILEOSTOMY  2002  . LAPAROTOMY N/A 06/04/2013   Procedure: EXPLORATORY LAPAROTOMY;  Surgeon: Jamesetta So, MD;  Location: [facility];  Service: General;  Laterality: N/A;   parastomal hernia repair  . LYSIS OF ADHESION N/A 06/04/2013   Procedure: LYSIS OF ADHESION;  Surgeon: Jamesetta So, MD;  Location: [facility];  Service: General;  Laterality: N/A;  . NASAL SINUS SURGERY    . SUBTOTAL COLECTOMY  2002  . TUBAL LIGATION     Medications: Reviewed & Updated - see associated section                       Current Outpatient Medications:  .  acetaminophen (TYLENOL) 500 MG tablet, Take 500 mg by mouth  as needed., Disp: , Rfl:  .  amLODipine (NORVASC) 5 MG tablet, Take 5 mg by mouth daily., Disp: , Rfl:  .  Multiple Vitamin (MULTIVITAMIN) tablet, Take 1 tablet by mouth every morning. , Disp: , Rfl:  .  olmesartan-hydrochlorothiazide (BENICAR HCT) 40-12.5 MG tablet, Take 1 tablet by mouth daily., Disp: , Rfl:  .  Omega-3 Fatty Acids (FISH OIL) 1000 MG CAPS, Take by mouth daily. , Disp: , Rfl:    Social History: Reviewed -  reports that she has never smoked. She has never used smokeless tobacco.  Objective Findings:  Vitals: Blood pressure (!) 164/60, pulse 80, height 5' 2"  (1.575 m), weight 160 lb 12.8 oz (72.9 kg).  PHYSICAL EXAMINATION General appearance - alert, well appearing, and in no distress, oriented to person, place, and time and normal appearing weight Mental status - alert, oriented to person, place, and time, normal mood, behavior, speech, dress, motor activity, and thought processes   PELVIC External genitalia -  normal Vulva - normal Vagina - normal Cervix - smooth, round, prolapsing mass coming out of cervic, possibly necrotic tissue. Measuring 4.99 x 3.36 cm by transvaginal bedside u/s Uterus - 8 cm in length  In office U/S done Biopsy taken of prolapsing cervical mass, only necrotic tisssue obtained so will discard and proceed with excision of mass ASAP. Patient given informed consent, signed copy in the chart, time out was performed. Appropriate time out taken. The patient was placed in the lithotomy position and the cervix brought into view with sterile speculum.  Portion of cervix cleansed x 2 with betadine swabs.  A tenaculum was placed in the anterior lip of the cervix.  The uterus was sounded for depth of 8 cm. Biopsy of prolapsing cervical mass taken. All equipment was removed and accounted for.The patient tolerated the procedure well.    Patient scheduled for D&C with removal of endometrial polyp, with hysteroscopy equipment to be available.  Procedure scheduled  for 815 tomorrow morning  Assessment & Plan:   A:  1. PMB 2. Large prolapsing endometrial polyp, 4.99 x 3.36 cm 3. Biopsy taken of prolapsing endometrial polyp  P:  1. Schedule D&C and hysteroscopy at [facility]. Call made today for appointment tomorrow morning preop labs today 2. Prescribe Cipro  3. F/u in 2 weeks   By signing my name below, I, Nicole Cochran, attest that this documentation has been prepared under the direction and in the presence of Jonnie Kind, MD. Electronically Signed: Jabier Gauss, Medical Scribe. 02/22/18. 2:35 PM.  I personally performed the services described in this documentation, which was SCRIBED in my presence. The recorded information has been reviewed and considered accurate. It has been edited as necessary during review. Jonnie Kind, MD

## 2018-02-23 ENCOUNTER — Ambulatory Visit (HOSPITAL_COMMUNITY): Payer: PPO | Admitting: Anesthesiology

## 2018-02-23 ENCOUNTER — Encounter (HOSPITAL_COMMUNITY)
Admission: RE | Disposition: A | Discharge status: Home / Self Care | Payer: Self-pay | Source: Ambulatory Visit | Attending: Obstetrics and Gynecology

## 2018-02-23 ENCOUNTER — Encounter (HOSPITAL_COMMUNITY): Payer: Self-pay | Admitting: *Deleted

## 2018-02-23 ENCOUNTER — Ambulatory Visit (HOSPITAL_COMMUNITY)
Admission: RE | Admit: 2018-02-23 | Discharge: 2018-02-23 | Disposition: A | Discharge status: Home / Self Care | Payer: PPO | Source: Ambulatory Visit | Attending: Obstetrics and Gynecology | Admitting: Obstetrics and Gynecology

## 2018-02-23 DIAGNOSIS — Z9842 Cataract extraction status, left eye: Secondary | ICD-10-CM | POA: Diagnosis not present

## 2018-02-23 DIAGNOSIS — M25561 Pain in right knee: Secondary | ICD-10-CM | POA: Insufficient documentation

## 2018-02-23 DIAGNOSIS — N840 Polyp of corpus uteri: Secondary | ICD-10-CM | POA: Diagnosis not present

## 2018-02-23 DIAGNOSIS — Z880 Allergy status to penicillin: Secondary | ICD-10-CM | POA: Diagnosis not present

## 2018-02-23 DIAGNOSIS — Z91048 Other nonmedicinal substance allergy status: Secondary | ICD-10-CM | POA: Insufficient documentation

## 2018-02-23 DIAGNOSIS — K5090 Crohn's disease, unspecified, without complications: Secondary | ICD-10-CM | POA: Diagnosis not present

## 2018-02-23 DIAGNOSIS — R011 Cardiac murmur, unspecified: Secondary | ICD-10-CM | POA: Diagnosis not present

## 2018-02-23 DIAGNOSIS — C541 Malignant neoplasm of endometrium: Secondary | ICD-10-CM | POA: Insufficient documentation

## 2018-02-23 DIAGNOSIS — Z9049 Acquired absence of other specified parts of digestive tract: Secondary | ICD-10-CM | POA: Insufficient documentation

## 2018-02-23 DIAGNOSIS — M1990 Unspecified osteoarthritis, unspecified site: Secondary | ICD-10-CM | POA: Diagnosis not present

## 2018-02-23 DIAGNOSIS — D069 Carcinoma in situ of cervix, unspecified: Secondary | ICD-10-CM | POA: Diagnosis not present

## 2018-02-23 DIAGNOSIS — N950 Postmenopausal bleeding: Secondary | ICD-10-CM | POA: Diagnosis not present

## 2018-02-23 DIAGNOSIS — Z7982 Long term (current) use of aspirin: Secondary | ICD-10-CM | POA: Insufficient documentation

## 2018-02-23 DIAGNOSIS — Z79899 Other long term (current) drug therapy: Secondary | ICD-10-CM | POA: Insufficient documentation

## 2018-02-23 DIAGNOSIS — Z9841 Cataract extraction status, right eye: Secondary | ICD-10-CM | POA: Diagnosis not present

## 2018-02-23 DIAGNOSIS — I10 Essential (primary) hypertension: Secondary | ICD-10-CM | POA: Insufficient documentation

## 2018-02-23 HISTORY — PX: HYSTEROSCOPY WITH D & C: SHX1775

## 2018-02-23 HISTORY — PX: POLYPECTOMY: SHX5525

## 2018-02-23 LAB — TYPE AND SCREEN
ABO/RH(D): A POS
Antibody Screen: NEGATIVE

## 2018-02-23 SURGERY — DILATATION AND CURETTAGE /HYSTEROSCOPY
Anesthesia: General

## 2018-02-23 MED ORDER — MIDAZOLAM HCL 2 MG/2ML IJ SOLN
1.0000 mg | INTRAMUSCULAR | Status: AC
  Administered 2018-02-23: 2 mg via INTRAVENOUS

## 2018-02-23 MED ORDER — EPHEDRINE SULFATE 50 MG/ML IJ SOLN
INTRAMUSCULAR | Status: AC
  Filled 2018-02-23: qty 1

## 2018-02-23 MED ORDER — FENTANYL CITRATE (PF) 100 MCG/2ML IJ SOLN: 25.0000 ug | INTRAMUSCULAR | Status: DC | PRN

## 2018-02-23 MED ORDER — PROPOFOL 10 MG/ML IV BOLUS
INTRAVENOUS | Status: AC
  Filled 2018-02-23: qty 20

## 2018-02-23 MED ORDER — METRONIDAZOLE IN NACL 5-0.79 MG/ML-% IV SOLN
500.0000 mg | INTRAVENOUS | Status: AC
  Administered 2018-02-23: 500 mg via INTRAVENOUS

## 2018-02-23 MED ORDER — MIDAZOLAM HCL 2 MG/2ML IJ SOLN
INTRAMUSCULAR | Status: AC
  Filled 2018-02-23: qty 2

## 2018-02-23 MED ORDER — SODIUM CHLORIDE 0.9 % IR SOLN
Status: DC | PRN
  Administered 2018-02-23: 3000 mL
  Administered 2018-02-23: 1000 mL

## 2018-02-23 MED ORDER — SODIUM CHLORIDE 0.9 % IJ SOLN
INTRAMUSCULAR | Status: AC
  Filled 2018-02-23: qty 10

## 2018-02-23 MED ORDER — ONDANSETRON HCL 4 MG/2ML IJ SOLN
4.0000 mg | Freq: Once | INTRAMUSCULAR | Status: AC
  Administered 2018-02-23: 4 mg via INTRAVENOUS

## 2018-02-23 MED ORDER — METRONIDAZOLE IN NACL 5-0.79 MG/ML-% IV SOLN
INTRAVENOUS | Status: AC
  Filled 2018-02-23: qty 100

## 2018-02-23 MED ORDER — EPHEDRINE SULFATE 50 MG/ML IJ SOLN
INTRAMUSCULAR | Status: DC | PRN
  Administered 2018-02-23 (×3): 5 mg via INTRAVENOUS

## 2018-02-23 MED ORDER — BUPIVACAINE-EPINEPHRINE (PF) 0.5% -1:200000 IJ SOLN
INTRAMUSCULAR | Status: AC
  Filled 2018-02-23: qty 30

## 2018-02-23 MED ORDER — ONDANSETRON HCL 4 MG/2ML IJ SOLN
INTRAMUSCULAR | Status: AC
  Filled 2018-02-23: qty 2

## 2018-02-23 MED ORDER — PROPOFOL 10 MG/ML IV BOLUS
INTRAVENOUS | Status: DC | PRN
  Administered 2018-02-23: 100 mg via INTRAVENOUS
  Administered 2018-02-23 (×2): 20 mg via INTRAVENOUS

## 2018-02-23 MED ORDER — CIPROFLOXACIN IN D5W 400 MG/200ML IV SOLN
INTRAVENOUS | Status: AC
  Filled 2018-02-23: qty 200

## 2018-02-23 MED ORDER — HYDROCODONE-ACETAMINOPHEN 5-325 MG PO TABS: 1.0000 | ORAL_TABLET | Freq: Four times a day (QID) | ORAL | 0 refills | Status: DC | PRN

## 2018-02-23 MED ORDER — FENTANYL CITRATE (PF) 100 MCG/2ML IJ SOLN
INTRAMUSCULAR | Status: AC
  Filled 2018-02-23: qty 2

## 2018-02-23 MED ORDER — FENTANYL CITRATE (PF) 100 MCG/2ML IJ SOLN
INTRAMUSCULAR | Status: DC | PRN
  Administered 2018-02-23 (×3): 25 ug via INTRAVENOUS

## 2018-02-23 MED ORDER — CIPROFLOXACIN IN D5W 400 MG/200ML IV SOLN
400.0000 mg | INTRAVENOUS | Status: AC
  Administered 2018-02-23: 400 mg via INTRAVENOUS

## 2018-02-23 MED ORDER — LACTATED RINGERS IV SOLN
INTRAVENOUS | Status: DC
  Administered 2018-02-23: 08:00:00 via INTRAVENOUS

## 2018-02-23 SURGICAL SUPPLY — 26 items
BAG HAMPER (MISCELLANEOUS) ×3 IMPLANT
CLOTH BEACON ORANGE TIMEOUT ST (SAFETY) ×3 IMPLANT
COVER LIGHT HANDLE STERIS (MISCELLANEOUS) ×6 IMPLANT
DECANTER SPIKE VIAL GLASS SM (MISCELLANEOUS) ×3 IMPLANT
ELECT REM PT RETURN 9FT ADLT (ELECTROSURGICAL) ×3
ELECTRODE REM PT RTRN 9FT ADLT (ELECTROSURGICAL) IMPLANT
GAUZE SPONGE 4X4 12PLY STRL (GAUZE/BANDAGES/DRESSINGS) ×3 IMPLANT
GLOVE BIOGEL PI IND STRL 7.0 (GLOVE) ×1 IMPLANT
GLOVE BIOGEL PI IND STRL 9 (GLOVE) ×1 IMPLANT
GLOVE BIOGEL PI INDICATOR 7.0 (GLOVE) ×2
GLOVE BIOGEL PI INDICATOR 9 (GLOVE) ×2
GLOVE ECLIPSE 6.5 STRL STRAW (GLOVE) ×2 IMPLANT
GLOVE ECLIPSE 9.0 STRL (GLOVE) ×3 IMPLANT
GOWN SPEC L3 XXLG W/TWL (GOWN DISPOSABLE) ×3 IMPLANT
GOWN STRL REUS W/TWL LRG LVL3 (GOWN DISPOSABLE) ×3 IMPLANT
INST SET HYSTEROSCOPY (KITS) ×3 IMPLANT
IV NS IRRIG 3000ML ARTHROMATIC (IV SOLUTION) ×2 IMPLANT
KIT TURNOVER KIT A (KITS) ×3 IMPLANT
MANIFOLD NEPTUNE II (INSTRUMENTS) ×3 IMPLANT
NS IRRIG 1000ML POUR BTL (IV SOLUTION) ×3 IMPLANT
PACK PERI GYN (CUSTOM PROCEDURE TRAY) ×3 IMPLANT
PAD ARMBOARD 7.5X6 YLW CONV (MISCELLANEOUS) ×3 IMPLANT
PAD TELFA 3X4 1S STER (GAUZE/BANDAGES/DRESSINGS) ×3 IMPLANT
SET BASIN LINEN APH (SET/KITS/TRAYS/PACK) ×3 IMPLANT
SET CYSTO W/LG BORE CLAMP LF (SET/KITS/TRAYS/PACK) ×3 IMPLANT
SYR CONTROL 10ML LL (SYRINGE) ×3 IMPLANT

## 2018-02-23 NOTE — Transfer of Care (Signed)
Immediate Anesthesia Transfer of Care Note  Patient: Nicole Cochran  Procedure(s) Performed: DILATATION AND CURETTAGE /HYSTEROSCOPY (N/A ) ENDOMETRIAL POLYPECTOMY (N/A )  Patient Location: PACU  Anesthesia Type:General  Level of Consciousness: awake, alert  and patient cooperative  Airway & Oxygen Therapy: Patient Spontanous Breathing and Patient connected to nasal cannula oxygen  Post-op Assessment: Report given to RN and Post -op Vital signs reviewed and stable  Post vital signs: Reviewed and stable  Last Vitals:  Vitals Value Taken Time  BP    Temp    Pulse    Resp    SpO2      Last Pain: There were no vitals filed for this visit.       Complications: No apparent anesthesia complications

## 2018-02-23 NOTE — Discharge Instructions (Addendum)
Hysteroscopy Hysteroscopy is a procedure used for looking inside the womb (uterus). It may be done for various reasons, including:  To evaluate abnormal bleeding, fibroid (benign, noncancerous) tumors, polyps, scar tissue (adhesions), and possibly cancer of the uterus.  To look for lumps (tumors) and other uterine growths.  To look for causes of why a woman cannot get pregnant (infertility), causes of recurrent loss of pregnancy (miscarriages), or a lost intrauterine device (IUD).  To perform a sterilization by blocking the fallopian tubes from inside the uterus.  In this procedure, a thin, flexible tube with a tiny light and camera on the end of it (hysteroscope) is used to look inside the uterus. A hysteroscopy should be done right after a menstrual period to be sure you are not pregnant. LET [facility] CARE PROVIDER KNOW ABOUT:  Any allergies you have.  All medicines you are taking, including vitamins, herbs, eye drops, creams, and over-the-counter medicines.  Previous problems you or members of your family have had with the use of anesthetics.  Any blood disorders you have.  Previous surgeries you have had.  Medical conditions you have. RISKS AND COMPLICATIONS Generally, this is a safe procedure. However, as with any procedure, complications can occur. Possible complications include:  Putting a hole in the uterus.  Excessive bleeding.  Infection.  Damage to the cervix.  Injury to other organs.  Allergic reaction to medicines.  Too much fluid used in the uterus for the procedure.  BEFORE THE PROCEDURE  Ask your health care provider about changing or stopping any regular medicines.  Do not take aspirin or blood thinners for 1 week before the procedure, or as directed by your health care provider. These can cause bleeding.  If you smoke, do not smoke for 2 weeks before the procedure.  In some cases, a medicine is placed in the cervix the day before the procedure.  This medicine makes the cervix have a larger opening (dilate). This makes it easier for the instrument to be inserted into the uterus during the procedure.  Do not eat or drink anything for at least 8 hours before the surgery.  Arrange for someone to take you home after the procedure. PROCEDURE  You may be given a medicine to relax you (sedative). You may also be given one of the following: ? A medicine that numbs the area around the cervix (local anesthetic). ? A medicine that makes you sleep through the procedure (general anesthetic).  The hysteroscope is inserted through the vagina into the uterus. The camera on the hysteroscope sends a picture to a TV screen. This gives the surgeon a good view inside the uterus.  During the procedure, air or a liquid is put into the uterus, which allows the surgeon to see better.  Sometimes, tissue is gently scraped from inside the uterus. These tissue samples are sent to a lab for testing. What to expect after the procedure  If you had a general anesthetic, you may be groggy for a couple hours after the procedure.  If you had a local anesthetic, you will be able to go home as soon as you are stable and feel ready.  You may have some cramping. This normally lasts for a couple days.  You may have bleeding, which varies from light spotting for a few days to menstrual-like bleeding for 3-7 days. This is normal.  If your test results are not back during the visit, make an appointment with your health care provider to find out the  results. This information is not intended to replace advice given to you by your health care provider. Make sure you discuss any questions you have with your health care provider. Document Released: 02/21/2001 Document Revised: 04/22/2016 Document Reviewed: 06/14/2013 Elsevier Interactive Patient Education  2017 [place] and [place]yl for 1 week please notify our office for heavy vaginal bleeding.  Or fever or  increasing abdominal pain.  Dr. Glo Herring may be reached on his cell phone at [phone] Dilation and Curettage or Vacuum Curettage Dilation and curettage (D&C) and vacuum curettage are minor procedures. A D&C involves stretching (dilation) the cervix and scraping (curettage) the inside lining of the uterus (endometrium). During a D&C, tissue is gently scraped from the endometrium, starting from the top portion of the uterus down to the lowest part of the uterus (cervix). During a vacuum curettage, the lining and tissue in the uterus are removed with the use of gentle suction. Curettage may be performed to either diagnose or treat a problem. As a diagnostic procedure, curettage is performed to examine tissues from the uterus. A diagnostic curettage may be done if you have:  Irregular bleeding in the uterus.  Bleeding with the development of clots.  Spotting between menstrual periods.  Prolonged menstrual periods or other abnormal bleeding.  Bleeding after menopause.  No menstrual period (amenorrhea).  A change in size and shape of the uterus.  Abnormal endometrial cells discovered during a Pap test.  As a treatment procedure, curettage may be performed for the following reasons:  Removal of an IUD (intrauterine device).  Removal of retained placenta after giving birth.  Abortion.  Miscarriage.  Removal of endometrial polyps.  Removal of uncommon types of noncancerous lumps (fibroids).  Tell a health care provider about:  Any allergies you have, including allergies to prescribed medicine or latex.  All medicines you are taking, including vitamins, herbs, eye drops, creams, and over-the-counter medicines. This is especially important if you take any blood-thinning medicine. Bring a list of all of your medicines to your appointment.  Any problems you or family members have had with anesthetic medicines.  Any blood disorders you have.  Any surgeries you have had.  Your  medical history and any medical conditions you have.  Whether you are pregnant or may be pregnant.  Recent vaginal infections you have had.  Recent menstrual periods, bleeding problems you have had, and what form of birth control (contraception) you use. What are the risks? Generally, this is a safe procedure. However, problems may occur, including:  Infection.  Heavy vaginal bleeding.  Allergic reactions to medicines.  Damage to the cervix or other structures or organs.  Development of scar tissue (adhesions) inside the uterus, which can cause abnormal amounts of menstrual bleeding. This may make it harder to get pregnant in the future.  A hole (perforation) or puncture in the uterine wall. This is rare.  What happens before the procedure? Staying hydrated Follow instructions from your health care provider about hydration, which may include:  Up to 2 hours before the procedure - you may continue to drink clear liquids, such as water, clear fruit juice, black coffee, and plain tea.  Eating and drinking restrictions Follow instructions from your health care provider about eating and drinking, which may include:  8 hours before the procedure - stop eating heavy meals or foods such as meat, fried foods, or fatty foods.  6 hours before the procedure - stop eating light meals or foods, such as toast or cereal.  6 hours before the procedure - stop drinking milk or drinks that contain milk.  2 hours before the procedure - stop drinking clear liquids. If your health care provider told you to take your medicine(s) on the day of your procedure, take them with only a sip of water.  Medicines  Ask your health care provider about: ? Changing or stopping your regular medicines. This is especially important if you are taking diabetes medicines or blood thinners. ? Taking medicines such as aspirin and ibuprofen. These medicines can thin your blood. Do not take these medicines before your  procedure if your health care provider instructs you not to.  You may be given antibiotic medicine to help prevent infection. General instructions  For 24 hours before your procedure, do not: ? Douche. ? Use tampons. ? Use medicines, creams, or suppositories in the vagina. ? Have sexual intercourse.  You may be given a pregnancy test on the day of the procedure.  Plan to have someone take you home from the hospital or clinic.  You may have a blood or urine sample taken.  If you will be going home right after the procedure, plan to have someone with you for 24 hours. What happens during the procedure?  To reduce your risk of infection: ? Your health care team will wash or sanitize their hands. ? Your skin will be washed with soap.  An IV tube will be inserted into one of your veins.  You will be given one of the following: ? A medicine that numbs the area in and around the cervix (local anesthetic). ? A medicine to make you fall asleep (general anesthetic).  You will lie down on your back, with your feet in foot rests (stirrups).  The size and position of your uterus will be checked.  A lubricated instrument (speculum or Sims retractor) will be inserted into the back side of your vagina. The speculum will be used to hold apart the walls of your vagina so your health care provider can see your cervix.  A tool (tenaculum) will be attached to the lip of the cervix to stabilize it.  Your cervix will be softened and dilated. This may be done by: ? Taking a medicine. ? Having tapered dilators or thin rods (laminaria) or gradual widening instruments (tapered dilators) inserted into your cervix.  A small, sharp, curved instrument (curette) will be used to scrape a small amount of tissue or cells from the endometrium or cervical canal. In some cases, gentle suction is applied with the curette. The curette will then be removed. The cells will be taken to a lab for testing. The  procedure may vary among health care providers and hospitals. What happens after the procedure?  You may have mild cramping, backache, pain, and light bleeding or spotting. You may pass small blood clots from your vagina.  You may have to wear compression stockings. These stockings help to prevent blood clots and reduce swelling in your legs.  Your blood pressure, heart rate, breathing rate, and blood oxygen level will be monitored until the medicines you were given have worn off. Summary  Dilation and curettage (D&C) involves stretching (dilation) the cervix and scraping (curettage) the inside lining of the uterus (endometrium).  After the procedure, you may have mild cramping, backache, pain, and light bleeding or spotting. You may pass small blood clots from your vagina.  Plan to have someone take you home from the hospital or clinic. This information is not intended to  replace advice given to you by your health care provider. Make sure you discuss any questions you have with your health care provider. Document Released: 11/15/2005 Document Revised: 08/01/2016 Document Reviewed: 08/01/2016 Elsevier Interactive Patient Education  2018 [employer].

## 2018-02-23 NOTE — H&P (Signed)
Nicole Cochran is an 83 y.o. female. She is presenting via Day Surgery for Surgical removal of a prolapsing endometrial polyp, that is quite large, as measured by u/s in my office yesterday. It appears on bedside u/s to have a posterior stalk. It may have been a type 0 fibroid. The majority of the polyp is prolapsing thru the dilated cervix, and is ecchymotic and necrotic over it's surface.  She has been having cramping and discharge x 2 weeks. She works as a Tourist information centre manager at [employer], and worked up until last Monday, stopping due to fatigue. The procedure has been reviewed. Risks of complications such as bleeding, infection, need to convert to hysterectomy have been reviewed with the patient who is alert, and with her son.  Pertinent Gynecological History: Menses: post-menopausal Bleeding: post menopausal bleeding Contraception: post menopausal status DES exposure: unknown Blood transfusions: none Sexually transmitted diseases: no past history Previous GYN Procedures:   Last mammogram: normal Date: 08/2015 Last pap: normal Date: 2010 OB History: G, P   Menstrual History: Menarche age:  No LMP recorded. Patient is postmenopausal.    Past Medical History:  Diagnosis Date  . Arthritis   . Crohn's   . Heart murmur   . Hypertension   . Pneumonia    83 years old,  and 2002  . Right knee pain     Past Surgical History:  Procedure Laterality Date  . APPENDECTOMY    . CATARACT EXTRACTION Bilateral 2009  . COLONOSCOPY  2011   @ [place]  . ILEOSTOMY  2002  . LAPAROTOMY N/A 06/04/2013   Procedure: EXPLORATORY LAPAROTOMY;  Surgeon: Jamesetta So, MD;  Location: [facility];  Service: General;  Laterality: N/A;   parastomal hernia repair  . LYSIS OF ADHESION N/A 06/04/2013   Procedure: LYSIS OF ADHESION;  Surgeon: Jamesetta So, MD;  Location: [facility];  Service: General;  Laterality: N/A;  . NASAL SINUS SURGERY    . SUBTOTAL COLECTOMY  2002  . TUBAL LIGATION      History reviewed. No pertinent family  history.  Social History:  reports that she has never smoked. She has never used smokeless tobacco. She reports that she does not drink alcohol or use drugs.  Allergies:  Allergies  Allergen Reactions  . Penicillins     Has patient had a PCN reaction causing immediate rash, facial/tongue/throat swelling, SOB or lightheadedness with hypotension: No Has patient had a PCN reaction causing severe rash involving mucus membranes or skin necrosis: Yes Has patient had a PCN reaction that required hospitalization: No Has patient had a PCN reaction occurring within the last 10 years: No If all of the above answers are "NO", then may proceed with Cephalosporin use.   . Tape Rash    Medications Prior to Admission  Medication Sig Dispense Refill Last Dose  . acetaminophen (TYLENOL) 500 MG tablet Take 500 mg by mouth every 8 (eight) hours as needed for mild pain or moderate pain.    Taking  . amLODipine (NORVASC) 5 MG tablet Take 5 mg by mouth daily.   02/22/2018 at Unknown time  . ciprofloxacin (CIPRO) 500 MG tablet Take 1 tablet (500 mg total) by mouth 2 (two) times daily. 14 tablet 0 02/22/2018 at Unknown time  . Multiple Vitamin (MULTIVITAMIN) tablet Take 1 tablet by mouth every morning.    Taking  . olmesartan-hydrochlorothiazide (BENICAR HCT) 40-12.5 MG tablet Take 1 tablet by mouth daily.   02/22/2018 at Unknown time  . Omega-3 Fatty Acids (  FISH OIL) 1000 MG CAPS Take 1 capsule by mouth daily.    Taking    ROS  There were no vitals taken for this visit. Physical Exam  Constitutional: She is oriented to person, place, and time. She appears well-developed.  HENT:  Head: Normocephalic.  Eyes: Pupils are equal, round, and reactive to light.  Neck: Normal range of motion.  Cardiovascular: Normal rate.  Respiratory: Effort normal.  GI: Soft. She exhibits no distension. There is no tenderness. There is no rebound.  Midline scar from colostomy. RLQ Stoma(Crohn's)  Genitourinary:  Genitourinary  Comments: EFG normal for age,  Vagina Mucoid D/c with good support Speculum reveals an ecchymotic mass protruding from the cervix, 3 cm diameter. The cervix is thin to palpation and the mass is mobile and seems to originate from further up in the uterus. TV u/s at bedside shows the mass appearing distinct surfaces reaching approx halfway to top of uterus, 4 cm in length,  There is some debris in the upper endometrial cavity.  Neurological: She is alert and oriented to person, place, and time.  Skin: Skin is warm and dry.  Psychiatric: Her behavior is normal. Judgment and thought content normal.    Results for orders placed or performed during the hospital encounter of 02/22/18 (from the past 24 hour(s))  CBC     Status: Abnormal   Collection Time: 02/22/18  4:00 PM  Result Value Ref Range   WBC 12.1 (H) 4.0 - 10.5 K/uL   RBC 3.56 (L) 3.87 - 5.11 MIL/uL   Hemoglobin 10.1 (L) 12.0 - 15.0 g/dL   HCT 29.8 (L) 36.0 - 46.0 %   MCV 83.7 78.0 - 100.0 fL   MCH 28.4 26.0 - 34.0 pg   MCHC 33.9 30.0 - 36.0 g/dL   RDW 12.9 11.5 - 15.5 %   Platelets 350 150 - 400 K/uL  Comprehensive metabolic panel     Status: Abnormal   Collection Time: 02/22/18  4:00 PM  Result Value Ref Range   Sodium 123 (L) 135 - 145 mmol/L   Potassium 4.6 3.5 - 5.1 mmol/L   Chloride 93 (L) 101 - 111 mmol/L   CO2 20 (L) 22 - 32 mmol/L   Glucose, Bld 132 (H) 65 - 99 mg/dL   BUN 19 6 - 20 mg/dL   Creatinine, Ser 0.78 0.44 - 1.00 mg/dL   Calcium 9.0 8.9 - 10.3 mg/dL   Total Protein 7.1 6.5 - 8.1 g/dL   Albumin 3.2 (L) 3.5 - 5.0 g/dL   AST 14 (L) 15 - 41 U/L   ALT 20 14 - 54 U/L   Alkaline Phosphatase 74 38 - 126 U/L   Total Bilirubin 0.5 0.3 - 1.2 mg/dL   GFR calc non Af Amer >60 >60 mL/min   GFR calc Af Amer >60 >60 mL/min   Anion gap 10 5 - 15  Urinalysis, Routine w reflex microscopic     Status: Abnormal   Collection Time: 02/22/18  4:00 PM  Result Value Ref Range   Color, Urine YELLOW YELLOW   APPearance CLOUDY  (A) CLEAR   Specific Gravity, Urine 1.012 1.005 - 1.030   pH 6.0 5.0 - 8.0   Glucose, UA NEGATIVE NEGATIVE mg/dL   Hgb urine dipstick LARGE (A) NEGATIVE   Bilirubin Urine NEGATIVE NEGATIVE   Ketones, ur NEGATIVE NEGATIVE mg/dL   Protein, ur 100 (A) NEGATIVE mg/dL   Nitrite NEGATIVE NEGATIVE   Leukocytes, UA LARGE (A) NEGATIVE   RBC /  HPF TOO NUMEROUS TO COUNT 0 - 5 RBC/hpf   WBC, UA TOO NUMEROUS TO COUNT 0 - 5 WBC/hpf   Bacteria, UA RARE (A) NONE SEEN   Squamous Epithelial / LPF NONE SEEN NONE SEEN    No results found.  Assessment/Plan: Prolapsing endometrial polyp, with associated Postmenopausal bleeding Plan: Excision of the polyp as part of hysteroscopy, D&C . Risks and plan reviewed this a.m  Jonnie Kind 02/23/2018, 7:58 AM

## 2018-02-23 NOTE — Op Note (Signed)
Please see the brief operative note for surgical details

## 2018-02-23 NOTE — Brief Op Note (Signed)
02/23/2018  9:58 AM  PATIENT:  Nicole Cochran  83 y.o. female  PRE-OPERATIVE DIAGNOSIS:  post menopausal bleeding, prolapsed endometrial polyp  POST-OPERATIVE DIAGNOSIS:  post menopausal bleeding, prolapsed endometrial polyp/mass  PROCEDURE:  Procedure(s): DILATATION AND CURETTAGE /HYSTEROSCOPY (N/A) ENDOMETRIAL POLYPECTOMY (N/A)  SURGEON:  Surgeon(s) and Role:    Jonnie Kind, MD - Primary  PHYSICIAN ASSISTANT:   ASSISTANTS: Zoila Shutter, CST  ANESTHESIA:   general  EBL:  20 mL   BLOOD ADMINISTERED:none  DRAINS: none   LOCAL MEDICATIONS USED:  NONE  SPECIMEN:  Source of Specimen:  Endometrial curettings, and portions of large endometrial polyp/mass  DISPOSITION OF SPECIMEN:  PATHOLOGY  COUNTS:  YES  TOURNIQUET:  * No tourniquets in log *  DICTATION: .Dragon Dictation  PLAN OF CARE: Discharge to home after PACU  PATIENT DISPOSITION:  PACU - hemodynamically stable.   Delay start of Pharmacological VTE agent (>24hrs) due to surgical blood loss or risk of bleeding: not applicable Details of procedure: Patient was taken the operating room prepped and draped for vaginal procedure with timeout conducted antibiotics administered prophylactically and surgical procedure confirmed by operative team. Speculum was inserted.  It was technically challenging to find the right speculum due to rather stenotic vagina.  A Peterson speculum could be used best.  And could barely exposed the tip of the mass.  Ring forceps could be used without the speculum to grasp the protruding portion of the necrotic mass and the mass came easily out in pieces.  After removal of probably 50 cc of tissue fragments some of which were fresh tissue and most were necrotic tissue, there was a large amount of polypoid tissue consistent with endometrial tissue that was able to be extracted.  Hysteroscopy followed and you could see a atrophic normal-appearing endometrial lining around a large mass which was  irregular and partially removed. no small stalk could be identified with hysteroscopy.  Single-tooth tenaculum could be used to grasp the anterior cervix which was essentially melted away into the vagina apex by the distention of the mass which had been removed. Brief curettage was then performed obtaining more tissue that appeared to be endometrial in character.  Hysteroscopy was repeated and there was minimal bleeding, but a moderate amount of remaining tissue from the shell of the mass which we decided to leave in site to due to the inability to delineate a base and being careful to avoid eroding through the myometrial wall. Speculum was inserted and the patient monitored for a few minutes there was essentially no bleeding per vagina. Tissue samples were sent to pathology and patient allowed to go to recovery room in stable condition.   Addendum: Patient tolerance of the procedure was discussed with son and son-in-law.  The son states that if referral is necessary should this be cancer he would prefer that she be sent to [facility] where she is been operated on before.

## 2018-02-23 NOTE — Anesthesia Postprocedure Evaluation (Signed)
Anesthesia Post Note  Patient: Nicole Cochran  Procedure(s) Performed: DILATATION AND CURETTAGE /HYSTEROSCOPY (N/A ) ENDOMETRIAL POLYPECTOMY (N/A )  Patient location during evaluation: PACU Anesthesia Type: General Level of consciousness: awake and alert and patient cooperative Pain management: satisfactory to patient Vital Signs Assessment: post-procedure vital signs reviewed and stable Respiratory status: spontaneous breathing Cardiovascular status: stable Postop Assessment: no apparent nausea or vomiting Anesthetic complications: no     Last Vitals:  Vitals:   02/23/18 1000 02/23/18 1015  BP: (!) 116/49 108/75  Pulse:  72  Resp: 16 18  Temp: 36.7 C   SpO2: 95% 97%    Last Pain:  Vitals:   02/23/18 1015  PainSc: 0-No pain                 Maryjo Ragon

## 2018-02-23 NOTE — Anesthesia Preprocedure Evaluation (Signed)
Anesthesia Evaluation  Patient identified by MRN, date of birth, ID band Patient awake    Reviewed: Allergy & Precautions, H&P , NPO status , Patient's Chart, lab work & pertinent test results  History of Anesthesia Complications Negative for: history of anesthetic complications  Airway Mallampati: II  TM Distance: >3 FB Neck ROM: Full    Dental  (+) Edentulous Upper, Edentulous Lower   Pulmonary    Pulmonary exam normal        Cardiovascular hypertension, Pt. on medications  Rhythm:Regular Rate:Normal     Neuro/Psych negative neurological ROS  negative psych ROS   GI/Hepatic neg GERD  ,Hx Crohn's dx, no steroids recently   Endo/Other    Renal/GU      Musculoskeletal  (+) Arthritis ,   Abdominal   Peds  Hematology   Anesthesia Other Findings SBO on NG suction.  Reproductive/Obstetrics                             Anesthesia Physical Anesthesia Plan  ASA: III  Anesthesia Plan: General   Post-op Pain Management:    Induction: Intravenous  PONV Risk Score and Plan:   Airway Management Planned: LMA  Additional Equipment:   Intra-op Plan:   Post-operative Plan: Extubation in OR  Informed Consent: I have reviewed the patients History and Physical, chart, labs and discussed the procedure including the risks, benefits and alternatives for the proposed anesthesia with the patient or authorized representative who has indicated his/her understanding and acceptance.     Plan Discussed with:   Anesthesia Plan Comments:         Anesthesia Quick Evaluation

## 2018-02-23 NOTE — Anesthesia Procedure Notes (Signed)
Procedure Name: LMA Insertion Date/Time: 02/23/2018 9:01 AM Performed by: Vista Deck, CRNA Pre-anesthesia Checklist: Patient identified, Patient being monitored, Emergency Drugs available, Timeout performed and Suction available Patient Re-evaluated:Patient Re-evaluated prior to induction Oxygen Delivery Method: Circle System Utilized Preoxygenation: Pre-oxygenation with 100% oxygen Induction Type: IV induction Ventilation: Mask ventilation without difficulty LMA: LMA inserted LMA Size: 4.0 Number of attempts: 1 Placement Confirmation: positive ETCO2 and breath sounds checked- equal and bilateral Tube secured with: Tape Dental Injury: Teeth and Oropharynx as per pre-operative assessment

## 2018-02-27 ENCOUNTER — Encounter (HOSPITAL_COMMUNITY): Payer: Self-pay | Admitting: Obstetrics and Gynecology

## 2018-02-27 DIAGNOSIS — M1711 Unilateral primary osteoarthritis, right knee: Secondary | ICD-10-CM | POA: Diagnosis not present

## 2018-02-27 DIAGNOSIS — Z933 Colostomy status: Secondary | ICD-10-CM | POA: Diagnosis not present

## 2018-02-27 DIAGNOSIS — R7301 Impaired fasting glucose: Secondary | ICD-10-CM | POA: Diagnosis not present

## 2018-02-27 DIAGNOSIS — E039 Hypothyroidism, unspecified: Secondary | ICD-10-CM | POA: Diagnosis not present

## 2018-02-27 DIAGNOSIS — E782 Mixed hyperlipidemia: Secondary | ICD-10-CM | POA: Diagnosis not present

## 2018-02-27 DIAGNOSIS — Z6829 Body mass index (BMI) 29.0-29.9, adult: Secondary | ICD-10-CM | POA: Diagnosis not present

## 2018-02-27 DIAGNOSIS — I10 Essential (primary) hypertension: Secondary | ICD-10-CM | POA: Diagnosis not present

## 2018-02-27 DIAGNOSIS — E871 Hypo-osmolality and hyponatremia: Secondary | ICD-10-CM | POA: Diagnosis not present

## 2018-02-27 DIAGNOSIS — R031 Nonspecific low blood-pressure reading: Secondary | ICD-10-CM | POA: Diagnosis not present

## 2018-02-27 DIAGNOSIS — R809 Proteinuria, unspecified: Secondary | ICD-10-CM | POA: Diagnosis not present

## 2018-02-27 DIAGNOSIS — F411 Generalized anxiety disorder: Secondary | ICD-10-CM | POA: Diagnosis not present

## 2018-02-27 DIAGNOSIS — K5090 Crohn's disease, unspecified, without complications: Secondary | ICD-10-CM | POA: Diagnosis not present

## 2018-02-28 ENCOUNTER — Encounter: Payer: Self-pay | Admitting: Obstetrics and Gynecology

## 2018-02-28 ENCOUNTER — Ambulatory Visit: Payer: PPO | Admitting: Obstetrics and Gynecology

## 2018-02-28 VITALS — BP 120/80 | HR 96 | Wt 160.4 lb

## 2018-02-28 DIAGNOSIS — C55 Malignant neoplasm of uterus, part unspecified: Secondary | ICD-10-CM | POA: Diagnosis not present

## 2018-02-28 DIAGNOSIS — Z9889 Other specified postprocedural states: Secondary | ICD-10-CM

## 2018-02-28 NOTE — Progress Notes (Signed)
Subjective:  Nicole Cochran is a 82 y.o. female now 1 weeks status post operative hysteroscopy.  Pathology for postmenopausal bleeding with a large mass in the cervix.  Probably 100 cc of tissue was removed :Diagnosis Endometrial polyp, and curettage - MALIGNANT MIXED MULLERIAN TUMOR (CARCINOSARCOMA).  Pt informed of the Results she has an upbeat attitude and says she will be 1 of those that recovers.  I have informed her that MMMT tumors are very aggressive and frequently require chemotherapy.  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  The patient is not having any pain.  Objective:  BP 120/80 (BP Location: Left Arm, Patient Position: Sitting, Cuff Size: Small)   Pulse 96   Wt 160 lb 6.4 oz (72.8 kg)   BMI 29.34 kg/m  General:Well developed, well nourished.  No acute distress.  Able to ambulate with the assistance of walker. Abdomen: Bowel sounds normal, soft, non-tender. Pelvic Exam:    External Genitalia:  Normal.    Vagina: Normal    Cervix: open 1.5 cm, lite blood per vagina, no malodor or purulence, much improved    Uterus: Normal nontender,     Adnexa/Bimanual: Normal   Incision(s):   Healing as expected, has bloody drainage, no erythema, no hernia, no swelling, no dehiscence,     Assessment:  Post-Op 1 weeks s/p operative hysteroscopy with  Diagnosis of malignant mixed mullerian tumor per pathology Will refer to [place] [facility] onc[facility] postoperatively..  Telephone call made, and case discussed with Dr. Shallowitz, GYN oncology, and orders received for CBC and see met today.  She will be seen tomorrow by Dr. Shallowitz, who will call the patient with the details of the time of the appointment.  Efforts will be  made to obtain CT abdomen pelvis and chest as a part of tomorrow's evaluation.   Plan:  1.referral to GYN oncology completed 2. . current medications.  Unchanged 3. Activity restrictions: none 4. return to work: not applicable. 5. Follow up in 1 day by GYN oncology in [place]. 6.  Obtain CBC and metabolic panel  

## 2018-03-01 ENCOUNTER — Encounter: Payer: Self-pay | Admitting: Obstetrics and Gynecology

## 2018-03-01 DIAGNOSIS — N939 Abnormal uterine and vaginal bleeding, unspecified: Secondary | ICD-10-CM | POA: Diagnosis not present

## 2018-03-01 DIAGNOSIS — R918 Other nonspecific abnormal finding of lung field: Secondary | ICD-10-CM | POA: Diagnosis not present

## 2018-03-01 DIAGNOSIS — C55 Malignant neoplasm of uterus, part unspecified: Secondary | ICD-10-CM | POA: Diagnosis not present

## 2018-03-01 DIAGNOSIS — K7689 Other specified diseases of liver: Secondary | ICD-10-CM | POA: Diagnosis not present

## 2018-03-01 LAB — CBC WITH DIFFERENTIAL/PLATELET
BASOS: 0 %
Basophils Absolute: 0 10*3/uL (ref 0.0–0.2)
EOS (ABSOLUTE): 0.1 10*3/uL (ref 0.0–0.4)
Eos: 1 %
HEMATOCRIT: 28.8 % — AB (ref 34.0–46.6)
Hemoglobin: 9.7 g/dL — ABNORMAL LOW (ref 11.1–15.9)
IMMATURE GRANS (ABS): 0 10*3/uL (ref 0.0–0.1)
IMMATURE GRANULOCYTES: 0 %
LYMPHS: 20 %
Lymphocytes Absolute: 1.7 10*3/uL (ref 0.7–3.1)
MCH: 28.8 pg (ref 26.6–33.0)
MCHC: 33.7 g/dL (ref 31.5–35.7)
MCV: 86 fL (ref 79–97)
MONOS ABS: 0.4 10*3/uL (ref 0.1–0.9)
Monocytes: 5 %
NEUTROS PCT: 74 %
Neutrophils Absolute: 6 10*3/uL (ref 1.4–7.0)
Platelets: 442 10*3/uL — ABNORMAL HIGH (ref 150–379)
RBC: 3.37 x10E6/uL — ABNORMAL LOW (ref 3.77–5.28)
RDW: 13.3 % (ref 12.3–15.4)
WBC: 8.2 10*3/uL (ref 3.4–10.8)

## 2018-03-01 LAB — COMPREHENSIVE METABOLIC PANEL
A/G RATIO: 1.6 (ref 1.2–2.2)
ALT: 13 IU/L (ref 0–32)
AST: 14 IU/L (ref 0–40)
Albumin: 3.7 g/dL (ref 3.5–4.7)
Alkaline Phosphatase: 82 IU/L (ref 39–117)
BUN / CREAT RATIO: 15 (ref 12–28)
BUN: 10 mg/dL (ref 8–27)
CALCIUM: 8.6 mg/dL — AB (ref 8.7–10.3)
CHLORIDE: 93 mmol/L — AB (ref 96–106)
CO2: 22 mmol/L (ref 20–29)
Creatinine, Ser: 0.65 mg/dL (ref 0.57–1.00)
GFR calc Af Amer: 97 mL/min/{1.73_m2} (ref >59)
GFR, EST NON AFRICAN AMERICAN: 84 mL/min/{1.73_m2} (ref >59)
GLOBULIN, TOTAL: 2.3 g/dL (ref 1.5–4.5)
Glucose: 114 mg/dL — ABNORMAL HIGH (ref 65–99)
POTASSIUM: 5 mmol/L (ref 3.5–5.2)
SODIUM: 127 mmol/L — AB (ref 134–144)
TOTAL PROTEIN: 6 g/dL (ref 6.0–8.5)

## 2018-03-03 DIAGNOSIS — Z932 Ileostomy status: Secondary | ICD-10-CM | POA: Diagnosis not present

## 2018-03-03 DIAGNOSIS — K8051 Calculus of bile duct without cholangitis or cholecystitis with obstruction: Secondary | ICD-10-CM | POA: Diagnosis not present

## 2018-03-03 DIAGNOSIS — Z933 Colostomy status: Secondary | ICD-10-CM | POA: Diagnosis not present

## 2018-03-06 DIAGNOSIS — C541 Malignant neoplasm of endometrium: Secondary | ICD-10-CM | POA: Diagnosis not present

## 2018-03-08 ENCOUNTER — Ambulatory Visit: Payer: PPO | Admitting: Obstetrics and Gynecology

## 2018-03-08 DIAGNOSIS — T451X5A Adverse effect of antineoplastic and immunosuppressive drugs, initial encounter: Secondary | ICD-10-CM | POA: Diagnosis not present

## 2018-03-08 DIAGNOSIS — C55 Malignant neoplasm of uterus, part unspecified: Secondary | ICD-10-CM | POA: Diagnosis not present

## 2018-03-08 DIAGNOSIS — R112 Nausea with vomiting, unspecified: Secondary | ICD-10-CM | POA: Diagnosis not present

## 2018-03-29 DIAGNOSIS — C55 Malignant neoplasm of uterus, part unspecified: Secondary | ICD-10-CM | POA: Diagnosis not present

## 2018-03-29 DIAGNOSIS — C787 Secondary malignant neoplasm of liver and intrahepatic bile duct: Secondary | ICD-10-CM | POA: Diagnosis not present

## 2018-03-29 DIAGNOSIS — C543 Malignant neoplasm of fundus uteri: Secondary | ICD-10-CM | POA: Diagnosis not present

## 2018-04-17 ENCOUNTER — Telehealth (INDEPENDENT_AMBULATORY_CARE_PROVIDER_SITE_OTHER): Payer: Self-pay | Admitting: Internal Medicine

## 2018-04-17 NOTE — Telephone Encounter (Signed)
Patient called stated she is taking chemo and wants to know if she should still come in  Ph# [phone]

## 2018-04-17 NOTE — Telephone Encounter (Signed)
Patient was advised that this would be addressed with Dr.Rehman , and we would call her back.

## 2018-04-18 NOTE — Telephone Encounter (Signed)
Per Dr.Rehman - the patient may wait until after Chemo is complete before appointment here. If she is having no problems.  Patient was called and made aware, she denies any problems with her Crohn's Disease and will contact us if any problems should arise.  Please cancel her appointment for 04/25/2018.

## 2018-04-19 DIAGNOSIS — Z932 Ileostomy status: Secondary | ICD-10-CM | POA: Diagnosis not present

## 2018-04-19 DIAGNOSIS — Z933 Colostomy status: Secondary | ICD-10-CM | POA: Diagnosis not present

## 2018-04-19 DIAGNOSIS — Z880 Allergy status to penicillin: Secondary | ICD-10-CM | POA: Diagnosis not present

## 2018-04-19 DIAGNOSIS — C55 Malignant neoplasm of uterus, part unspecified: Secondary | ICD-10-CM | POA: Diagnosis not present

## 2018-04-19 DIAGNOSIS — K8051 Calculus of bile duct without cholangitis or cholecystitis with obstruction: Secondary | ICD-10-CM | POA: Diagnosis not present

## 2018-04-19 DIAGNOSIS — C787 Secondary malignant neoplasm of liver and intrahepatic bile duct: Secondary | ICD-10-CM | POA: Diagnosis not present

## 2018-04-19 DIAGNOSIS — I499 Cardiac arrhythmia, unspecified: Secondary | ICD-10-CM | POA: Diagnosis not present

## 2018-04-25 ENCOUNTER — Ambulatory Visit (INDEPENDENT_AMBULATORY_CARE_PROVIDER_SITE_OTHER): Payer: PPO | Admitting: Internal Medicine

## 2018-05-03 DIAGNOSIS — E041 Nontoxic single thyroid nodule: Secondary | ICD-10-CM | POA: Diagnosis not present

## 2018-05-03 DIAGNOSIS — R590 Localized enlarged lymph nodes: Secondary | ICD-10-CM | POA: Diagnosis not present

## 2018-05-03 DIAGNOSIS — C55 Malignant neoplasm of uterus, part unspecified: Secondary | ICD-10-CM | POA: Diagnosis not present

## 2018-05-03 DIAGNOSIS — I499 Cardiac arrhythmia, unspecified: Secondary | ICD-10-CM | POA: Diagnosis not present

## 2018-05-03 DIAGNOSIS — K7689 Other specified diseases of liver: Secondary | ICD-10-CM | POA: Diagnosis not present

## 2018-05-03 DIAGNOSIS — I440 Atrioventricular block, first degree: Secondary | ICD-10-CM | POA: Diagnosis not present

## 2018-05-10 DIAGNOSIS — C549 Malignant neoplasm of corpus uteri, unspecified: Secondary | ICD-10-CM | POA: Diagnosis not present

## 2018-05-10 DIAGNOSIS — Z01818 Encounter for other preprocedural examination: Secondary | ICD-10-CM | POA: Diagnosis not present

## 2018-05-10 DIAGNOSIS — Z9221 Personal history of antineoplastic chemotherapy: Secondary | ICD-10-CM | POA: Diagnosis not present

## 2018-05-10 DIAGNOSIS — C787 Secondary malignant neoplasm of liver and intrahepatic bile duct: Secondary | ICD-10-CM | POA: Diagnosis not present

## 2018-05-10 DIAGNOSIS — K435 Parastomal hernia without obstruction or  gangrene: Secondary | ICD-10-CM | POA: Diagnosis not present

## 2018-05-10 DIAGNOSIS — C55 Malignant neoplasm of uterus, part unspecified: Secondary | ICD-10-CM | POA: Diagnosis not present

## 2018-05-10 DIAGNOSIS — Z933 Colostomy status: Secondary | ICD-10-CM | POA: Diagnosis not present

## 2018-05-10 DIAGNOSIS — Z880 Allergy status to penicillin: Secondary | ICD-10-CM | POA: Diagnosis not present

## 2018-05-17 DIAGNOSIS — Z79899 Other long term (current) drug therapy: Secondary | ICD-10-CM | POA: Diagnosis not present

## 2018-05-17 DIAGNOSIS — E669 Obesity, unspecified: Secondary | ICD-10-CM | POA: Diagnosis not present

## 2018-05-17 DIAGNOSIS — E785 Hyperlipidemia, unspecified: Secondary | ICD-10-CM | POA: Diagnosis not present

## 2018-05-17 DIAGNOSIS — K9172 Accidental puncture and laceration of a digestive system organ or structure during other procedure: Secondary | ICD-10-CM | POA: Diagnosis not present

## 2018-05-17 DIAGNOSIS — I161 Hypertensive emergency: Secondary | ICD-10-CM | POA: Diagnosis not present

## 2018-05-17 DIAGNOSIS — Z01818 Encounter for other preprocedural examination: Secondary | ICD-10-CM | POA: Diagnosis not present

## 2018-05-17 DIAGNOSIS — E876 Hypokalemia: Secondary | ICD-10-CM | POA: Diagnosis not present

## 2018-05-17 DIAGNOSIS — N179 Acute kidney failure, unspecified: Secondary | ICD-10-CM | POA: Diagnosis not present

## 2018-05-17 DIAGNOSIS — R0902 Hypoxemia: Secondary | ICD-10-CM | POA: Diagnosis not present

## 2018-05-17 DIAGNOSIS — Z9841 Cataract extraction status, right eye: Secondary | ICD-10-CM | POA: Diagnosis not present

## 2018-05-17 DIAGNOSIS — I110 Hypertensive heart disease with heart failure: Secondary | ICD-10-CM | POA: Diagnosis not present

## 2018-05-17 DIAGNOSIS — Z9049 Acquired absence of other specified parts of digestive tract: Secondary | ICD-10-CM | POA: Diagnosis not present

## 2018-05-17 DIAGNOSIS — K560 Paralytic ileus: Secondary | ICD-10-CM | POA: Diagnosis not present

## 2018-05-17 DIAGNOSIS — G893 Neoplasm related pain (acute) (chronic): Secondary | ICD-10-CM | POA: Diagnosis not present

## 2018-05-17 DIAGNOSIS — I517 Cardiomegaly: Secondary | ICD-10-CM | POA: Diagnosis not present

## 2018-05-17 DIAGNOSIS — R011 Cardiac murmur, unspecified: Secondary | ICD-10-CM | POA: Diagnosis not present

## 2018-05-17 DIAGNOSIS — I959 Hypotension, unspecified: Secondary | ICD-10-CM | POA: Diagnosis not present

## 2018-05-17 DIAGNOSIS — I5030 Unspecified diastolic (congestive) heart failure: Secondary | ICD-10-CM | POA: Diagnosis not present

## 2018-05-17 DIAGNOSIS — Z9221 Personal history of antineoplastic chemotherapy: Secondary | ICD-10-CM | POA: Diagnosis not present

## 2018-05-17 DIAGNOSIS — R7303 Prediabetes: Secondary | ICD-10-CM | POA: Diagnosis not present

## 2018-05-17 DIAGNOSIS — Z932 Ileostomy status: Secondary | ICD-10-CM | POA: Diagnosis not present

## 2018-05-17 DIAGNOSIS — K5090 Crohn's disease, unspecified, without complications: Secondary | ICD-10-CM | POA: Diagnosis not present

## 2018-05-17 DIAGNOSIS — I358 Other nonrheumatic aortic valve disorders: Secondary | ICD-10-CM | POA: Diagnosis not present

## 2018-05-17 DIAGNOSIS — C549 Malignant neoplasm of corpus uteri, unspecified: Secondary | ICD-10-CM | POA: Diagnosis not present

## 2018-05-17 DIAGNOSIS — K660 Peritoneal adhesions (postprocedural) (postinfection): Secondary | ICD-10-CM | POA: Diagnosis not present

## 2018-05-17 DIAGNOSIS — D509 Iron deficiency anemia, unspecified: Secondary | ICD-10-CM | POA: Diagnosis not present

## 2018-05-17 DIAGNOSIS — C787 Secondary malignant neoplasm of liver and intrahepatic bile duct: Secondary | ICD-10-CM | POA: Diagnosis not present

## 2018-05-17 DIAGNOSIS — I361 Nonrheumatic tricuspid (valve) insufficiency: Secondary | ICD-10-CM | POA: Diagnosis not present

## 2018-05-17 DIAGNOSIS — K5190 Ulcerative colitis, unspecified, without complications: Secondary | ICD-10-CM | POA: Diagnosis not present

## 2018-05-17 DIAGNOSIS — C775 Secondary and unspecified malignant neoplasm of intrapelvic lymph nodes: Secondary | ICD-10-CM | POA: Diagnosis not present

## 2018-05-23 DIAGNOSIS — Z9221 Personal history of antineoplastic chemotherapy: Secondary | ICD-10-CM | POA: Diagnosis not present

## 2018-05-23 DIAGNOSIS — R933 Abnormal findings on diagnostic imaging of other parts of digestive tract: Secondary | ICD-10-CM | POA: Diagnosis not present

## 2018-05-23 DIAGNOSIS — I161 Hypertensive emergency: Secondary | ICD-10-CM | POA: Diagnosis not present

## 2018-05-23 DIAGNOSIS — E876 Hypokalemia: Secondary | ICD-10-CM | POA: Diagnosis not present

## 2018-05-23 DIAGNOSIS — C541 Malignant neoplasm of endometrium: Secondary | ICD-10-CM | POA: Diagnosis not present

## 2018-05-23 DIAGNOSIS — Z9981 Dependence on supplemental oxygen: Secondary | ICD-10-CM | POA: Diagnosis not present

## 2018-05-23 DIAGNOSIS — Z978 Presence of other specified devices: Secondary | ICD-10-CM | POA: Diagnosis not present

## 2018-05-23 DIAGNOSIS — Z9841 Cataract extraction status, right eye: Secondary | ICD-10-CM | POA: Diagnosis not present

## 2018-05-23 DIAGNOSIS — K9172 Accidental puncture and laceration of a digestive system organ or structure during other procedure: Secondary | ICD-10-CM | POA: Diagnosis not present

## 2018-05-23 DIAGNOSIS — Z4659 Encounter for fitting and adjustment of other gastrointestinal appliance and device: Secondary | ICD-10-CM | POA: Diagnosis not present

## 2018-05-23 DIAGNOSIS — K5090 Crohn's disease, unspecified, without complications: Secondary | ICD-10-CM | POA: Diagnosis not present

## 2018-05-23 DIAGNOSIS — E669 Obesity, unspecified: Secondary | ICD-10-CM | POA: Diagnosis not present

## 2018-05-23 DIAGNOSIS — N179 Acute kidney failure, unspecified: Secondary | ICD-10-CM | POA: Diagnosis not present

## 2018-05-23 DIAGNOSIS — R011 Cardiac murmur, unspecified: Secondary | ICD-10-CM | POA: Diagnosis not present

## 2018-05-23 DIAGNOSIS — J9601 Acute respiratory failure with hypoxia: Secondary | ICD-10-CM | POA: Diagnosis not present

## 2018-05-23 DIAGNOSIS — C549 Malignant neoplasm of corpus uteri, unspecified: Secondary | ICD-10-CM | POA: Diagnosis not present

## 2018-05-23 DIAGNOSIS — K660 Peritoneal adhesions (postprocedural) (postinfection): Secondary | ICD-10-CM | POA: Diagnosis not present

## 2018-05-23 DIAGNOSIS — Z9049 Acquired absence of other specified parts of digestive tract: Secondary | ICD-10-CM | POA: Diagnosis not present

## 2018-05-23 DIAGNOSIS — Z932 Ileostomy status: Secondary | ICD-10-CM | POA: Diagnosis not present

## 2018-05-23 DIAGNOSIS — K9189 Other postprocedural complications and disorders of digestive system: Secondary | ICD-10-CM | POA: Diagnosis not present

## 2018-05-23 DIAGNOSIS — I440 Atrioventricular block, first degree: Secondary | ICD-10-CM | POA: Diagnosis not present

## 2018-05-23 DIAGNOSIS — R112 Nausea with vomiting, unspecified: Secondary | ICD-10-CM | POA: Diagnosis not present

## 2018-05-23 DIAGNOSIS — G893 Neoplasm related pain (acute) (chronic): Secondary | ICD-10-CM | POA: Diagnosis not present

## 2018-05-23 DIAGNOSIS — I5030 Unspecified diastolic (congestive) heart failure: Secondary | ICD-10-CM | POA: Diagnosis not present

## 2018-05-23 DIAGNOSIS — C55 Malignant neoplasm of uterus, part unspecified: Secondary | ICD-10-CM | POA: Diagnosis not present

## 2018-05-23 DIAGNOSIS — D509 Iron deficiency anemia, unspecified: Secondary | ICD-10-CM | POA: Diagnosis not present

## 2018-05-23 DIAGNOSIS — J811 Chronic pulmonary edema: Secondary | ICD-10-CM | POA: Diagnosis not present

## 2018-05-23 DIAGNOSIS — R918 Other nonspecific abnormal finding of lung field: Secondary | ICD-10-CM | POA: Diagnosis not present

## 2018-05-23 DIAGNOSIS — R7303 Prediabetes: Secondary | ICD-10-CM | POA: Diagnosis not present

## 2018-05-23 DIAGNOSIS — K560 Paralytic ileus: Secondary | ICD-10-CM | POA: Diagnosis not present

## 2018-05-23 DIAGNOSIS — C787 Secondary malignant neoplasm of liver and intrahepatic bile duct: Secondary | ICD-10-CM | POA: Diagnosis not present

## 2018-05-23 DIAGNOSIS — I491 Atrial premature depolarization: Secondary | ICD-10-CM | POA: Diagnosis not present

## 2018-05-23 DIAGNOSIS — I959 Hypotension, unspecified: Secondary | ICD-10-CM | POA: Diagnosis not present

## 2018-05-23 DIAGNOSIS — Z9889 Other specified postprocedural states: Secondary | ICD-10-CM | POA: Diagnosis not present

## 2018-05-23 DIAGNOSIS — Z79899 Other long term (current) drug therapy: Secondary | ICD-10-CM | POA: Diagnosis not present

## 2018-05-23 DIAGNOSIS — R140 Abdominal distension (gaseous): Secondary | ICD-10-CM | POA: Diagnosis not present

## 2018-05-23 DIAGNOSIS — I160 Hypertensive urgency: Secondary | ICD-10-CM | POA: Diagnosis not present

## 2018-05-23 DIAGNOSIS — G8918 Other acute postprocedural pain: Secondary | ICD-10-CM | POA: Diagnosis not present

## 2018-05-23 DIAGNOSIS — I110 Hypertensive heart disease with heart failure: Secondary | ICD-10-CM | POA: Diagnosis not present

## 2018-05-23 DIAGNOSIS — E785 Hyperlipidemia, unspecified: Secondary | ICD-10-CM | POA: Diagnosis not present

## 2018-05-23 DIAGNOSIS — C775 Secondary and unspecified malignant neoplasm of intrapelvic lymph nodes: Secondary | ICD-10-CM | POA: Diagnosis not present

## 2018-05-23 DIAGNOSIS — R0902 Hypoxemia: Secondary | ICD-10-CM | POA: Diagnosis not present

## 2018-06-05 DIAGNOSIS — Z483 Aftercare following surgery for neoplasm: Secondary | ICD-10-CM | POA: Diagnosis not present

## 2018-06-05 DIAGNOSIS — C787 Secondary malignant neoplasm of liver and intrahepatic bile duct: Secondary | ICD-10-CM | POA: Diagnosis not present

## 2018-06-05 DIAGNOSIS — K5000 Crohn's disease of small intestine without complications: Secondary | ICD-10-CM | POA: Diagnosis not present

## 2018-06-05 DIAGNOSIS — C55 Malignant neoplasm of uterus, part unspecified: Secondary | ICD-10-CM | POA: Diagnosis not present

## 2018-06-05 DIAGNOSIS — Z932 Ileostomy status: Secondary | ICD-10-CM | POA: Diagnosis not present

## 2018-06-05 DIAGNOSIS — I10 Essential (primary) hypertension: Secondary | ICD-10-CM | POA: Diagnosis not present

## 2018-06-07 DIAGNOSIS — C55 Malignant neoplasm of uterus, part unspecified: Secondary | ICD-10-CM | POA: Diagnosis not present

## 2018-06-07 DIAGNOSIS — Z483 Aftercare following surgery for neoplasm: Secondary | ICD-10-CM | POA: Diagnosis not present

## 2018-06-07 DIAGNOSIS — C787 Secondary malignant neoplasm of liver and intrahepatic bile duct: Secondary | ICD-10-CM | POA: Diagnosis not present

## 2018-06-09 DIAGNOSIS — Z483 Aftercare following surgery for neoplasm: Secondary | ICD-10-CM | POA: Diagnosis not present

## 2018-06-09 DIAGNOSIS — C55 Malignant neoplasm of uterus, part unspecified: Secondary | ICD-10-CM | POA: Diagnosis not present

## 2018-06-09 DIAGNOSIS — C787 Secondary malignant neoplasm of liver and intrahepatic bile duct: Secondary | ICD-10-CM | POA: Diagnosis not present

## 2018-06-13 DIAGNOSIS — F411 Generalized anxiety disorder: Secondary | ICD-10-CM | POA: Diagnosis not present

## 2018-06-13 DIAGNOSIS — N939 Abnormal uterine and vaginal bleeding, unspecified: Secondary | ICD-10-CM | POA: Diagnosis not present

## 2018-06-13 DIAGNOSIS — Z6828 Body mass index (BMI) 28.0-28.9, adult: Secondary | ICD-10-CM | POA: Diagnosis not present

## 2018-06-13 DIAGNOSIS — Z90710 Acquired absence of both cervix and uterus: Secondary | ICD-10-CM | POA: Diagnosis not present

## 2018-06-13 DIAGNOSIS — M545 Low back pain: Secondary | ICD-10-CM | POA: Diagnosis not present

## 2018-06-13 DIAGNOSIS — R809 Proteinuria, unspecified: Secondary | ICD-10-CM | POA: Diagnosis not present

## 2018-06-13 DIAGNOSIS — N390 Urinary tract infection, site not specified: Secondary | ICD-10-CM | POA: Diagnosis not present

## 2018-06-13 DIAGNOSIS — M62830 Muscle spasm of back: Secondary | ICD-10-CM | POA: Diagnosis not present

## 2018-06-13 DIAGNOSIS — R7301 Impaired fasting glucose: Secondary | ICD-10-CM | POA: Diagnosis not present

## 2018-06-13 DIAGNOSIS — E039 Hypothyroidism, unspecified: Secondary | ICD-10-CM | POA: Diagnosis not present

## 2018-06-13 DIAGNOSIS — E782 Mixed hyperlipidemia: Secondary | ICD-10-CM | POA: Diagnosis not present

## 2018-06-13 DIAGNOSIS — I10 Essential (primary) hypertension: Secondary | ICD-10-CM | POA: Diagnosis not present

## 2018-06-14 DIAGNOSIS — Z483 Aftercare following surgery for neoplasm: Secondary | ICD-10-CM | POA: Diagnosis not present

## 2018-06-14 DIAGNOSIS — K5000 Crohn's disease of small intestine without complications: Secondary | ICD-10-CM | POA: Diagnosis not present

## 2018-06-14 DIAGNOSIS — S31109A Unspecified open wound of abdominal wall, unspecified quadrant without penetration into peritoneal cavity, initial encounter: Secondary | ICD-10-CM | POA: Diagnosis not present

## 2018-06-19 DIAGNOSIS — C787 Secondary malignant neoplasm of liver and intrahepatic bile duct: Secondary | ICD-10-CM | POA: Diagnosis not present

## 2018-06-19 DIAGNOSIS — C549 Malignant neoplasm of corpus uteri, unspecified: Secondary | ICD-10-CM | POA: Diagnosis not present

## 2018-06-22 ENCOUNTER — Other Ambulatory Visit: Payer: Self-pay | Admitting: *Deleted

## 2018-06-22 ENCOUNTER — Encounter: Payer: Self-pay | Admitting: *Deleted

## 2018-06-22 DIAGNOSIS — Z5111 Encounter for antineoplastic chemotherapy: Secondary | ICD-10-CM | POA: Diagnosis not present

## 2018-06-22 DIAGNOSIS — Z8542 Personal history of malignant neoplasm of other parts of uterus: Secondary | ICD-10-CM | POA: Diagnosis not present

## 2018-06-22 DIAGNOSIS — C787 Secondary malignant neoplasm of liver and intrahepatic bile duct: Secondary | ICD-10-CM | POA: Diagnosis not present

## 2018-06-22 DIAGNOSIS — C548 Malignant neoplasm of overlapping sites of corpus uteri: Secondary | ICD-10-CM | POA: Diagnosis not present

## 2018-06-22 NOTE — Patient Outreach (Signed)
[facility]) Care Management  06/22/2018  Nicole Cochran October 27, 1935 [id number]  Referral via [place]; member discharged from [facility] 7/7.  Telephone call to patient; left HIPPA compliant voice mail requesting call back.  Plan: Geophysicist/field seismologist. Follow up 2-4 days.  Sherrin Daisy, RN BSN [place] Management Coordinator [facility] Care Management  [phone]

## 2018-06-26 DIAGNOSIS — Z933 Colostomy status: Secondary | ICD-10-CM | POA: Diagnosis not present

## 2018-06-26 DIAGNOSIS — Z932 Ileostomy status: Secondary | ICD-10-CM | POA: Diagnosis not present

## 2018-06-26 DIAGNOSIS — K8051 Calculus of bile duct without cholangitis or cholecystitis with obstruction: Secondary | ICD-10-CM | POA: Diagnosis not present

## 2018-06-27 ENCOUNTER — Other Ambulatory Visit: Payer: Self-pay | Admitting: *Deleted

## 2018-06-27 NOTE — Patient Outreach (Signed)
[place] [facility]) Care Management  06/27/2018  Nicole Cochran Oct 01, 1935 [id number]   Referral via [place]; member discharged from [facility] 7/7.  Telephone call #2; left hippa compliant voice mail.  Plan: Follow up in 2-4 business days. Outreach was sent 06/22/2018.  Sherrin Daisy, RN BSN [place] Management Coordinator [facility] Care Management  [phone]

## 2018-06-28 ENCOUNTER — Ambulatory Visit: Payer: Self-pay | Admitting: *Deleted

## 2018-07-03 ENCOUNTER — Other Ambulatory Visit: Payer: Self-pay | Admitting: *Deleted

## 2018-07-03 NOTE — Patient Outreach (Signed)
[place] [facility]) Care Management  07/03/2018  Nicole Cochran Aug 17, 1935 [id number]  Referral via [place]; member discharged from [facility] 7/7.  Telephone call#3 to patient; phone rings but unable to leave message.  Received incoming call from patient who was advised of reason for call. HIPPa verification received.  Patient advised of [facility] services.  Patient voices she " does not do any of that". Patient  declined [facility] services. States she does not need contact information.   Plan: Case closure.   Sherrin Daisy, RN BSN [place] Management Coordinator [facility] Care Management  [phone]

## 2018-07-12 DIAGNOSIS — C775 Secondary and unspecified malignant neoplasm of intrapelvic lymph nodes: Secondary | ICD-10-CM | POA: Diagnosis not present

## 2018-07-12 DIAGNOSIS — C787 Secondary malignant neoplasm of liver and intrahepatic bile duct: Secondary | ICD-10-CM | POA: Diagnosis not present

## 2018-07-12 DIAGNOSIS — R7989 Other specified abnormal findings of blood chemistry: Secondary | ICD-10-CM | POA: Diagnosis not present

## 2018-07-12 DIAGNOSIS — Z79899 Other long term (current) drug therapy: Secondary | ICD-10-CM | POA: Diagnosis not present

## 2018-07-12 DIAGNOSIS — Z9079 Acquired absence of other genital organ(s): Secondary | ICD-10-CM | POA: Diagnosis not present

## 2018-07-12 DIAGNOSIS — Z90722 Acquired absence of ovaries, bilateral: Secondary | ICD-10-CM | POA: Diagnosis not present

## 2018-07-12 DIAGNOSIS — C55 Malignant neoplasm of uterus, part unspecified: Secondary | ICD-10-CM | POA: Diagnosis not present

## 2018-07-12 DIAGNOSIS — Z90710 Acquired absence of both cervix and uterus: Secondary | ICD-10-CM | POA: Diagnosis not present

## 2018-07-12 DIAGNOSIS — C548 Malignant neoplasm of overlapping sites of corpus uteri: Secondary | ICD-10-CM | POA: Diagnosis not present

## 2018-07-14 DIAGNOSIS — C55 Malignant neoplasm of uterus, part unspecified: Secondary | ICD-10-CM | POA: Diagnosis not present

## 2018-08-02 DIAGNOSIS — Z79899 Other long term (current) drug therapy: Secondary | ICD-10-CM | POA: Diagnosis not present

## 2018-08-02 DIAGNOSIS — C55 Malignant neoplasm of uterus, part unspecified: Secondary | ICD-10-CM | POA: Diagnosis not present

## 2018-08-02 DIAGNOSIS — C548 Malignant neoplasm of overlapping sites of corpus uteri: Secondary | ICD-10-CM | POA: Diagnosis not present

## 2018-08-02 DIAGNOSIS — C787 Secondary malignant neoplasm of liver and intrahepatic bile duct: Secondary | ICD-10-CM | POA: Diagnosis not present

## 2018-08-02 DIAGNOSIS — Z5111 Encounter for antineoplastic chemotherapy: Secondary | ICD-10-CM | POA: Diagnosis not present

## 2018-08-10 DIAGNOSIS — M545 Low back pain: Secondary | ICD-10-CM | POA: Diagnosis not present

## 2018-08-10 DIAGNOSIS — M62830 Muscle spasm of back: Secondary | ICD-10-CM | POA: Diagnosis not present

## 2018-08-10 DIAGNOSIS — I10 Essential (primary) hypertension: Secondary | ICD-10-CM | POA: Diagnosis not present

## 2018-08-10 DIAGNOSIS — Z90710 Acquired absence of both cervix and uterus: Secondary | ICD-10-CM | POA: Diagnosis not present

## 2018-08-10 DIAGNOSIS — F411 Generalized anxiety disorder: Secondary | ICD-10-CM | POA: Diagnosis not present

## 2018-08-10 DIAGNOSIS — N939 Abnormal uterine and vaginal bleeding, unspecified: Secondary | ICD-10-CM | POA: Diagnosis not present

## 2018-08-10 DIAGNOSIS — R7301 Impaired fasting glucose: Secondary | ICD-10-CM | POA: Diagnosis not present

## 2018-08-10 DIAGNOSIS — R809 Proteinuria, unspecified: Secondary | ICD-10-CM | POA: Diagnosis not present

## 2018-08-10 DIAGNOSIS — E782 Mixed hyperlipidemia: Secondary | ICD-10-CM | POA: Diagnosis not present

## 2018-08-10 DIAGNOSIS — M1711 Unilateral primary osteoarthritis, right knee: Secondary | ICD-10-CM | POA: Diagnosis not present

## 2018-08-10 DIAGNOSIS — N390 Urinary tract infection, site not specified: Secondary | ICD-10-CM | POA: Diagnosis not present

## 2018-08-10 DIAGNOSIS — E039 Hypothyroidism, unspecified: Secondary | ICD-10-CM | POA: Diagnosis not present

## 2018-08-14 DIAGNOSIS — Z933 Colostomy status: Secondary | ICD-10-CM | POA: Diagnosis not present

## 2018-08-14 DIAGNOSIS — Z932 Ileostomy status: Secondary | ICD-10-CM | POA: Diagnosis not present

## 2018-08-14 DIAGNOSIS — K8051 Calculus of bile duct without cholangitis or cholecystitis with obstruction: Secondary | ICD-10-CM | POA: Diagnosis not present

## 2018-08-16 DIAGNOSIS — I10 Essential (primary) hypertension: Secondary | ICD-10-CM | POA: Diagnosis not present

## 2018-08-16 DIAGNOSIS — Z6829 Body mass index (BMI) 29.0-29.9, adult: Secondary | ICD-10-CM | POA: Diagnosis not present

## 2018-08-16 DIAGNOSIS — Z0000 Encounter for general adult medical examination without abnormal findings: Secondary | ICD-10-CM | POA: Diagnosis not present

## 2018-08-16 DIAGNOSIS — C541 Malignant neoplasm of endometrium: Secondary | ICD-10-CM | POA: Diagnosis not present

## 2018-08-16 DIAGNOSIS — E782 Mixed hyperlipidemia: Secondary | ICD-10-CM | POA: Diagnosis not present

## 2018-08-16 DIAGNOSIS — B373 Candidiasis of vulva and vagina: Secondary | ICD-10-CM | POA: Diagnosis not present

## 2018-08-16 DIAGNOSIS — D509 Iron deficiency anemia, unspecified: Secondary | ICD-10-CM | POA: Diagnosis not present

## 2018-08-16 DIAGNOSIS — R7301 Impaired fasting glucose: Secondary | ICD-10-CM | POA: Diagnosis not present

## 2018-08-23 DIAGNOSIS — I898 Other specified noninfective disorders of lymphatic vessels and lymph nodes: Secondary | ICD-10-CM | POA: Diagnosis not present

## 2018-08-23 DIAGNOSIS — Z90710 Acquired absence of both cervix and uterus: Secondary | ICD-10-CM | POA: Diagnosis not present

## 2018-08-23 DIAGNOSIS — C55 Malignant neoplasm of uterus, part unspecified: Secondary | ICD-10-CM | POA: Diagnosis not present

## 2018-08-23 DIAGNOSIS — C787 Secondary malignant neoplasm of liver and intrahepatic bile duct: Secondary | ICD-10-CM | POA: Diagnosis not present

## 2018-08-23 DIAGNOSIS — Z880 Allergy status to penicillin: Secondary | ICD-10-CM | POA: Diagnosis not present

## 2018-08-23 DIAGNOSIS — C549 Malignant neoplasm of corpus uteri, unspecified: Secondary | ICD-10-CM | POA: Diagnosis not present

## 2018-08-29 DIAGNOSIS — I898 Other specified noninfective disorders of lymphatic vessels and lymph nodes: Secondary | ICD-10-CM | POA: Diagnosis not present

## 2018-09-21 DIAGNOSIS — Z961 Presence of intraocular lens: Secondary | ICD-10-CM | POA: Diagnosis not present

## 2018-09-21 DIAGNOSIS — H18421 Band keratopathy, right eye: Secondary | ICD-10-CM | POA: Diagnosis not present

## 2018-09-25 ENCOUNTER — Encounter (INDEPENDENT_AMBULATORY_CARE_PROVIDER_SITE_OTHER): Payer: Self-pay | Admitting: Internal Medicine

## 2018-09-25 ENCOUNTER — Ambulatory Visit (INDEPENDENT_AMBULATORY_CARE_PROVIDER_SITE_OTHER): Payer: PPO | Admitting: Internal Medicine

## 2018-09-25 VITALS — BP 170/82 | HR 72 | Temp 98.0°F | Ht 62.0 in | Wt 163.7 lb

## 2018-09-25 DIAGNOSIS — K5000 Crohn's disease of small intestine without complications: Secondary | ICD-10-CM | POA: Diagnosis not present

## 2018-09-25 NOTE — Progress Notes (Signed)
Subjective:    Patient ID: Nicole Cochran, female    DOB: [birth year], 83 y.o.   MRN: [id number]  HPI Here today for f/u. Hx of Crohn's disease.  Recent underwent a total abdominal hysterectomy, bilateral salpingo-oophorectomy, rt pelvic and para-aortic lymphadenectomy, small bowel resection.  05/23/2018.  She was last seen in our office 04/20/2017. In 2002, underwent a subtotal colectomy with ileostomy. Hs remained in remission.  She is doing good. She wants to go back to work at [employer]. She is trying to stay active. Her appetite is good. She empties her ileostomy about every 2 hrs or as needed. No melena or BRRB.   Flex sigmoidoscopy in 10/2011.  Review of Systems Past Medical History:  Diagnosis Date  . Arthritis   . Crohn's   . Heart murmur   . Hypertension   . Pneumonia    83 years old,  and 2002  . Right knee pain     Past Surgical History:  Procedure Laterality Date  . APPENDECTOMY    . CATARACT EXTRACTION Bilateral 2009  . COLONOSCOPY  2011   @ [place]  . HYSTEROSCOPY W/D&C N/A 02/23/2018   Procedure: DILATATION AND CURETTAGE /HYSTEROSCOPY;  Surgeon: Jonnie Kind, MD;  Location: [facility];  Service: Gynecology;  Laterality: N/A;  . ILEOSTOMY  2002  . LAPAROTOMY N/A 06/04/2013   Procedure: EXPLORATORY LAPAROTOMY;  Surgeon: Jamesetta So, MD;  Location: [facility];  Service: General;  Laterality: N/A;   parastomal hernia repair  . LYSIS OF ADHESION N/A 06/04/2013   Procedure: LYSIS OF ADHESION;  Surgeon: Jamesetta So, MD;  Location: [facility];  Service: General;  Laterality: N/A;  . NASAL SINUS SURGERY    . POLYPECTOMY N/A 02/23/2018   Procedure: ENDOMETRIAL POLYPECTOMY;  Surgeon: Jonnie Kind, MD;  Location: [facility];  Service: Gynecology;  Laterality: N/A;  . SUBTOTAL COLECTOMY  2002  . TUBAL LIGATION      Allergies  Allergen Reactions  . Penicillins     Has patient had a PCN reaction causing immediate rash, facial/tongue/throat swelling, SOB or lightheadedness with  hypotension: No Has patient had a PCN reaction causing severe rash involving mucus membranes or skin necrosis: Yes Has patient had a PCN reaction that required hospitalization: No Has patient had a PCN reaction occurring within the last 10 years: No If all of the above answers are "NO", then may proceed with Cephalosporin use.   . Tape Rash    Current Outpatient Medications on File Prior to Visit  Medication Sig Dispense Refill  . acetaminophen (TYLENOL) 500 MG tablet Take 500 mg by mouth every 8 (eight) hours as needed for mild pain or moderate pain.     Marland Kitchen amLODipine (NORVASC) 5 MG tablet Take 5 mg by mouth daily.    . Cholecalciferol (VITAMIN D3) 1000 units CAPS Take 400 Units by mouth.    . cyanocobalamin 100 MCG tablet Take by mouth 2 (two) times daily after a meal.    . ferrous sulfate 325 (65 FE) MG tablet Take 325 mg by mouth daily with breakfast.    . Multiple Vitamin (MULTIVITAMIN) tablet Take 1 tablet by mouth every morning.     . olmesartan-hydrochlorothiazide (BENICAR HCT) 40-12.5 MG tablet Take 1 tablet by mouth daily.    . Omega-3 Fatty Acids (FISH OIL) 1000 MG CAPS Take 1 capsule by mouth daily.      No current facility-administered medications on file prior to visit.  Objective:   Physical Exam Blood pressure (!) 170/82, pulse 72, temperature 98 F (36.7 C), height 5' 2"  (1.575 m), weight 163 lb 11.2 oz (74.3 kg). Alert and oriented. Skin warm and dry. Oral mucosa is moist.   . Sclera anicteric, conjunctivae is pink. Clouding to rt cornea (patient is aware and has an appt with eye Dr.) Thyroid not enlarged. No cervical lymphadenopathy. Lungs clear. Heart regular rate and rhythm.  Abdomen is soft. Bowel sounds are positive. No hepatomegaly. No abdominal masses felt. No tenderness.  No edema to lower extremities.          Assessment & Plan:  Crohn's disease. She is doing well. She seems to be in remission. Will; see back in 1 year.

## 2018-09-25 NOTE — Patient Instructions (Signed)
OV in 1 year.  

## 2018-09-28 DIAGNOSIS — Z932 Ileostomy status: Secondary | ICD-10-CM | POA: Diagnosis not present

## 2018-09-28 DIAGNOSIS — K8051 Calculus of bile duct without cholangitis or cholecystitis with obstruction: Secondary | ICD-10-CM | POA: Diagnosis not present

## 2018-09-28 DIAGNOSIS — Z933 Colostomy status: Secondary | ICD-10-CM | POA: Diagnosis not present

## 2018-10-11 DIAGNOSIS — N1330 Unspecified hydronephrosis: Secondary | ICD-10-CM | POA: Diagnosis not present

## 2018-10-11 DIAGNOSIS — Z880 Allergy status to penicillin: Secondary | ICD-10-CM | POA: Diagnosis not present

## 2018-10-11 DIAGNOSIS — Z09 Encounter for follow-up examination after completed treatment for conditions other than malignant neoplasm: Secondary | ICD-10-CM | POA: Diagnosis not present

## 2018-10-11 DIAGNOSIS — C55 Malignant neoplasm of uterus, part unspecified: Secondary | ICD-10-CM | POA: Diagnosis not present

## 2018-10-11 DIAGNOSIS — N1339 Other hydronephrosis: Secondary | ICD-10-CM | POA: Diagnosis not present

## 2018-10-11 DIAGNOSIS — Z9889 Other specified postprocedural states: Secondary | ICD-10-CM | POA: Diagnosis not present

## 2018-10-11 DIAGNOSIS — I898 Other specified noninfective disorders of lymphatic vessels and lymph nodes: Secondary | ICD-10-CM | POA: Diagnosis not present

## 2018-10-11 DIAGNOSIS — C787 Secondary malignant neoplasm of liver and intrahepatic bile duct: Secondary | ICD-10-CM | POA: Diagnosis not present

## 2018-10-11 DIAGNOSIS — C549 Malignant neoplasm of corpus uteri, unspecified: Secondary | ICD-10-CM | POA: Diagnosis not present

## 2018-10-30 DIAGNOSIS — H1711 Central corneal opacity, right eye: Secondary | ICD-10-CM | POA: Diagnosis not present

## 2018-10-30 DIAGNOSIS — H18421 Band keratopathy, right eye: Secondary | ICD-10-CM | POA: Diagnosis not present

## 2018-10-30 DIAGNOSIS — Z961 Presence of intraocular lens: Secondary | ICD-10-CM | POA: Diagnosis not present

## 2018-11-04 DIAGNOSIS — K8051 Calculus of bile duct without cholangitis or cholecystitis with obstruction: Secondary | ICD-10-CM | POA: Diagnosis not present

## 2018-11-04 DIAGNOSIS — Z933 Colostomy status: Secondary | ICD-10-CM | POA: Diagnosis not present

## 2018-11-04 DIAGNOSIS — Z932 Ileostomy status: Secondary | ICD-10-CM | POA: Diagnosis not present

## 2018-11-06 DIAGNOSIS — E782 Mixed hyperlipidemia: Secondary | ICD-10-CM | POA: Diagnosis not present

## 2018-11-06 DIAGNOSIS — C541 Malignant neoplasm of endometrium: Secondary | ICD-10-CM | POA: Diagnosis not present

## 2018-11-06 DIAGNOSIS — D509 Iron deficiency anemia, unspecified: Secondary | ICD-10-CM | POA: Diagnosis not present

## 2018-11-06 DIAGNOSIS — R7301 Impaired fasting glucose: Secondary | ICD-10-CM | POA: Diagnosis not present

## 2018-11-06 DIAGNOSIS — I10 Essential (primary) hypertension: Secondary | ICD-10-CM | POA: Diagnosis not present

## 2018-12-13 DIAGNOSIS — K8051 Calculus of bile duct without cholangitis or cholecystitis with obstruction: Secondary | ICD-10-CM | POA: Diagnosis not present

## 2018-12-13 DIAGNOSIS — Z932 Ileostomy status: Secondary | ICD-10-CM | POA: Diagnosis not present

## 2018-12-13 DIAGNOSIS — Z933 Colostomy status: Secondary | ICD-10-CM | POA: Diagnosis not present

## 2019-01-10 DIAGNOSIS — C549 Malignant neoplasm of corpus uteri, unspecified: Secondary | ICD-10-CM | POA: Diagnosis not present

## 2019-01-10 DIAGNOSIS — C787 Secondary malignant neoplasm of liver and intrahepatic bile duct: Secondary | ICD-10-CM | POA: Diagnosis not present

## 2019-01-10 DIAGNOSIS — Z8542 Personal history of malignant neoplasm of other parts of uterus: Secondary | ICD-10-CM | POA: Diagnosis not present

## 2019-01-23 DIAGNOSIS — Z933 Colostomy status: Secondary | ICD-10-CM | POA: Diagnosis not present

## 2019-01-23 DIAGNOSIS — Z932 Ileostomy status: Secondary | ICD-10-CM | POA: Diagnosis not present

## 2019-01-23 DIAGNOSIS — K8051 Calculus of bile duct without cholangitis or cholecystitis with obstruction: Secondary | ICD-10-CM | POA: Diagnosis not present

## 2019-02-21 DIAGNOSIS — D509 Iron deficiency anemia, unspecified: Secondary | ICD-10-CM | POA: Diagnosis not present

## 2019-02-21 DIAGNOSIS — E039 Hypothyroidism, unspecified: Secondary | ICD-10-CM | POA: Diagnosis not present

## 2019-02-21 DIAGNOSIS — R7301 Impaired fasting glucose: Secondary | ICD-10-CM | POA: Diagnosis not present

## 2019-02-21 DIAGNOSIS — I10 Essential (primary) hypertension: Secondary | ICD-10-CM | POA: Diagnosis not present

## 2019-02-21 DIAGNOSIS — E782 Mixed hyperlipidemia: Secondary | ICD-10-CM | POA: Diagnosis not present

## 2019-02-22 DIAGNOSIS — Z933 Colostomy status: Secondary | ICD-10-CM | POA: Diagnosis not present

## 2019-02-22 DIAGNOSIS — K8051 Calculus of bile duct without cholangitis or cholecystitis with obstruction: Secondary | ICD-10-CM | POA: Diagnosis not present

## 2019-02-22 DIAGNOSIS — Z932 Ileostomy status: Secondary | ICD-10-CM | POA: Diagnosis not present

## 2019-03-01 DIAGNOSIS — R7301 Impaired fasting glucose: Secondary | ICD-10-CM | POA: Diagnosis not present

## 2019-03-01 DIAGNOSIS — E782 Mixed hyperlipidemia: Secondary | ICD-10-CM | POA: Diagnosis not present

## 2019-03-01 DIAGNOSIS — R946 Abnormal results of thyroid function studies: Secondary | ICD-10-CM | POA: Diagnosis not present

## 2019-03-01 DIAGNOSIS — R7303 Prediabetes: Secondary | ICD-10-CM | POA: Diagnosis not present

## 2019-03-01 DIAGNOSIS — I10 Essential (primary) hypertension: Secondary | ICD-10-CM | POA: Diagnosis not present

## 2019-03-01 DIAGNOSIS — E7800 Pure hypercholesterolemia, unspecified: Secondary | ICD-10-CM | POA: Diagnosis not present

## 2019-03-01 DIAGNOSIS — D509 Iron deficiency anemia, unspecified: Secondary | ICD-10-CM | POA: Diagnosis not present

## 2019-03-30 DIAGNOSIS — Z932 Ileostomy status: Secondary | ICD-10-CM | POA: Diagnosis not present

## 2019-03-30 DIAGNOSIS — K8051 Calculus of bile duct without cholangitis or cholecystitis with obstruction: Secondary | ICD-10-CM | POA: Diagnosis not present

## 2019-03-30 DIAGNOSIS — Z933 Colostomy status: Secondary | ICD-10-CM | POA: Diagnosis not present

## 2019-04-09 DIAGNOSIS — E039 Hypothyroidism, unspecified: Secondary | ICD-10-CM | POA: Diagnosis not present

## 2019-04-11 DIAGNOSIS — Z0000 Encounter for general adult medical examination without abnormal findings: Secondary | ICD-10-CM | POA: Diagnosis not present

## 2019-04-18 DIAGNOSIS — Z9079 Acquired absence of other genital organ(s): Secondary | ICD-10-CM | POA: Diagnosis not present

## 2019-04-18 DIAGNOSIS — C52 Malignant neoplasm of vagina: Secondary | ICD-10-CM | POA: Diagnosis not present

## 2019-04-18 DIAGNOSIS — Z90710 Acquired absence of both cervix and uterus: Secondary | ICD-10-CM | POA: Diagnosis not present

## 2019-04-18 DIAGNOSIS — Z9221 Personal history of antineoplastic chemotherapy: Secondary | ICD-10-CM | POA: Diagnosis not present

## 2019-04-18 DIAGNOSIS — C549 Malignant neoplasm of corpus uteri, unspecified: Secondary | ICD-10-CM | POA: Diagnosis not present

## 2019-04-18 DIAGNOSIS — Z90722 Acquired absence of ovaries, bilateral: Secondary | ICD-10-CM | POA: Diagnosis not present

## 2019-04-18 DIAGNOSIS — C787 Secondary malignant neoplasm of liver and intrahepatic bile duct: Secondary | ICD-10-CM | POA: Diagnosis not present

## 2019-04-18 DIAGNOSIS — C55 Malignant neoplasm of uterus, part unspecified: Secondary | ICD-10-CM | POA: Diagnosis not present

## 2019-04-24 DIAGNOSIS — C55 Malignant neoplasm of uterus, part unspecified: Secondary | ICD-10-CM | POA: Diagnosis not present

## 2019-04-24 DIAGNOSIS — R590 Localized enlarged lymph nodes: Secondary | ICD-10-CM | POA: Diagnosis not present

## 2019-04-25 DIAGNOSIS — C549 Malignant neoplasm of corpus uteri, unspecified: Secondary | ICD-10-CM | POA: Diagnosis not present

## 2019-04-25 DIAGNOSIS — C7951 Secondary malignant neoplasm of bone: Secondary | ICD-10-CM | POA: Diagnosis not present

## 2019-04-28 DIAGNOSIS — Z932 Ileostomy status: Secondary | ICD-10-CM | POA: Diagnosis not present

## 2019-04-28 DIAGNOSIS — Z933 Colostomy status: Secondary | ICD-10-CM | POA: Diagnosis not present

## 2019-04-28 DIAGNOSIS — K8051 Calculus of bile duct without cholangitis or cholecystitis with obstruction: Secondary | ICD-10-CM | POA: Diagnosis not present

## 2019-05-01 DIAGNOSIS — C787 Secondary malignant neoplasm of liver and intrahepatic bile duct: Secondary | ICD-10-CM | POA: Diagnosis not present

## 2019-05-01 DIAGNOSIS — C55 Malignant neoplasm of uterus, part unspecified: Secondary | ICD-10-CM | POA: Diagnosis not present

## 2019-05-01 DIAGNOSIS — C549 Malignant neoplasm of corpus uteri, unspecified: Secondary | ICD-10-CM | POA: Diagnosis not present

## 2019-05-02 DIAGNOSIS — C549 Malignant neoplasm of corpus uteri, unspecified: Secondary | ICD-10-CM | POA: Diagnosis not present

## 2019-05-02 DIAGNOSIS — C7800 Secondary malignant neoplasm of unspecified lung: Secondary | ICD-10-CM | POA: Diagnosis not present

## 2019-05-02 DIAGNOSIS — C7951 Secondary malignant neoplasm of bone: Secondary | ICD-10-CM

## 2019-05-02 DIAGNOSIS — C778 Secondary and unspecified malignant neoplasm of lymph nodes of multiple regions: Secondary | ICD-10-CM | POA: Diagnosis not present

## 2019-05-02 DIAGNOSIS — Z510 Encounter for antineoplastic radiation therapy: Secondary | ICD-10-CM | POA: Diagnosis not present

## 2019-05-02 DIAGNOSIS — C55 Malignant neoplasm of uterus, part unspecified: Secondary | ICD-10-CM | POA: Diagnosis not present

## 2019-05-02 DIAGNOSIS — C787 Secondary malignant neoplasm of liver and intrahepatic bile duct: Secondary | ICD-10-CM | POA: Diagnosis not present

## 2019-05-02 HISTORY — DX: Secondary malignant neoplasm of bone: C79.51

## 2019-05-07 DIAGNOSIS — C7951 Secondary malignant neoplasm of bone: Secondary | ICD-10-CM | POA: Diagnosis not present

## 2019-05-07 DIAGNOSIS — C55 Malignant neoplasm of uterus, part unspecified: Secondary | ICD-10-CM | POA: Diagnosis not present

## 2019-05-09 DIAGNOSIS — E039 Hypothyroidism, unspecified: Secondary | ICD-10-CM | POA: Diagnosis not present

## 2019-05-09 DIAGNOSIS — R946 Abnormal results of thyroid function studies: Secondary | ICD-10-CM | POA: Diagnosis not present

## 2019-05-10 DIAGNOSIS — Z510 Encounter for antineoplastic radiation therapy: Secondary | ICD-10-CM | POA: Diagnosis not present

## 2019-05-10 DIAGNOSIS — C7951 Secondary malignant neoplasm of bone: Secondary | ICD-10-CM | POA: Diagnosis not present

## 2019-05-10 DIAGNOSIS — C55 Malignant neoplasm of uterus, part unspecified: Secondary | ICD-10-CM | POA: Diagnosis not present

## 2019-05-16 DIAGNOSIS — C55 Malignant neoplasm of uterus, part unspecified: Secondary | ICD-10-CM | POA: Diagnosis not present

## 2019-05-16 DIAGNOSIS — C7951 Secondary malignant neoplasm of bone: Secondary | ICD-10-CM | POA: Diagnosis not present

## 2019-05-22 DIAGNOSIS — C55 Malignant neoplasm of uterus, part unspecified: Secondary | ICD-10-CM | POA: Diagnosis not present

## 2019-05-22 DIAGNOSIS — C7951 Secondary malignant neoplasm of bone: Secondary | ICD-10-CM | POA: Diagnosis not present

## 2019-06-03 ENCOUNTER — Emergency Department (HOSPITAL_COMMUNITY)
Admission: EM | Admit: 2019-06-03 | Discharge: 2019-06-03 | Disposition: A | Discharge status: Home / Self Care | Payer: PPO | Attending: Emergency Medicine | Admitting: Emergency Medicine

## 2019-06-03 ENCOUNTER — Other Ambulatory Visit: Payer: Self-pay

## 2019-06-03 ENCOUNTER — Encounter (HOSPITAL_COMMUNITY): Payer: Self-pay | Admitting: Emergency Medicine

## 2019-06-03 ENCOUNTER — Emergency Department (HOSPITAL_COMMUNITY): Payer: PPO

## 2019-06-03 DIAGNOSIS — M545 Low back pain: Secondary | ICD-10-CM | POA: Insufficient documentation

## 2019-06-03 DIAGNOSIS — C7952 Secondary malignant neoplasm of bone marrow: Secondary | ICD-10-CM | POA: Insufficient documentation

## 2019-06-03 DIAGNOSIS — N1330 Unspecified hydronephrosis: Secondary | ICD-10-CM | POA: Diagnosis not present

## 2019-06-03 DIAGNOSIS — Z03818 Encounter for observation for suspected exposure to other biological agents ruled out: Secondary | ICD-10-CM | POA: Insufficient documentation

## 2019-06-03 DIAGNOSIS — N3000 Acute cystitis without hematuria: Secondary | ICD-10-CM | POA: Diagnosis not present

## 2019-06-03 DIAGNOSIS — I10 Essential (primary) hypertension: Secondary | ICD-10-CM | POA: Insufficient documentation

## 2019-06-03 DIAGNOSIS — Z79899 Other long term (current) drug therapy: Secondary | ICD-10-CM | POA: Diagnosis not present

## 2019-06-03 DIAGNOSIS — R0781 Pleurodynia: Secondary | ICD-10-CM | POA: Diagnosis not present

## 2019-06-03 DIAGNOSIS — C7951 Secondary malignant neoplasm of bone: Secondary | ICD-10-CM | POA: Diagnosis not present

## 2019-06-03 DIAGNOSIS — M549 Dorsalgia, unspecified: Secondary | ICD-10-CM

## 2019-06-03 DIAGNOSIS — C799 Secondary malignant neoplasm of unspecified site: Secondary | ICD-10-CM

## 2019-06-03 DIAGNOSIS — R109 Unspecified abdominal pain: Secondary | ICD-10-CM | POA: Diagnosis not present

## 2019-06-03 DIAGNOSIS — N1339 Other hydronephrosis: Secondary | ICD-10-CM | POA: Diagnosis not present

## 2019-06-03 DIAGNOSIS — C787 Secondary malignant neoplasm of liver and intrahepatic bile duct: Secondary | ICD-10-CM | POA: Diagnosis not present

## 2019-06-03 DIAGNOSIS — C801 Malignant (primary) neoplasm, unspecified: Secondary | ICD-10-CM | POA: Diagnosis not present

## 2019-06-03 HISTORY — DX: Malignant neoplasm of corpus uteri, unspecified: C54.9

## 2019-06-03 HISTORY — DX: Malignant (primary) neoplasm, unspecified: C80.1

## 2019-06-03 LAB — URINALYSIS, ROUTINE W REFLEX MICROSCOPIC
Bilirubin Urine: NEGATIVE
Glucose, UA: NEGATIVE mg/dL
Ketones, ur: NEGATIVE mg/dL
Nitrite: NEGATIVE
Protein, ur: 30 mg/dL — AB
RBC / HPF: >50 RBC/hpf — ABNORMAL HIGH (ref 0–5)
Specific Gravity, Urine: 1.021 (ref 1.005–1.030)
pH: 7 (ref 5.0–8.0)

## 2019-06-03 LAB — CBC WITH DIFFERENTIAL/PLATELET
Abs Immature Granulocytes: 0.03 10*3/uL (ref 0.00–0.07)
Basophils Absolute: 0 10*3/uL (ref 0.0–0.1)
Basophils Relative: 0 %
Eosinophils Absolute: 0 10*3/uL (ref 0.0–0.5)
Eosinophils Relative: 0 %
HCT: 29.3 % — ABNORMAL LOW (ref 36.0–46.0)
Hemoglobin: 10.2 g/dL — ABNORMAL LOW (ref 12.0–15.0)
Immature Granulocytes: 0 %
Lymphocytes Relative: 8 %
Lymphs Abs: 0.5 10*3/uL — ABNORMAL LOW (ref 0.7–4.0)
MCH: 29.3 pg (ref 26.0–34.0)
MCHC: 34.8 g/dL (ref 30.0–36.0)
MCV: 84.2 fL (ref 80.0–100.0)
Monocytes Absolute: 0.4 10*3/uL (ref 0.1–1.0)
Monocytes Relative: 6 %
Neutro Abs: 5.8 10*3/uL (ref 1.7–7.7)
Neutrophils Relative %: 86 %
Platelets: 239 10*3/uL (ref 150–400)
RBC: 3.48 MIL/uL — ABNORMAL LOW (ref 3.87–5.11)
RDW: 14.6 % (ref 11.5–15.5)
WBC: 6.8 10*3/uL (ref 4.0–10.5)
nRBC: 0 % (ref 0.0–0.2)

## 2019-06-03 LAB — COMPREHENSIVE METABOLIC PANEL
ALT: 35 U/L (ref 0–44)
AST: 31 U/L (ref 15–41)
Albumin: 3.7 g/dL (ref 3.5–5.0)
Alkaline Phosphatase: 128 U/L — ABNORMAL HIGH (ref 38–126)
Anion gap: 13 (ref 5–15)
BUN: 25 mg/dL — ABNORMAL HIGH (ref 8–23)
CO2: 21 mmol/L — ABNORMAL LOW (ref 22–32)
Calcium: 9.7 mg/dL (ref 8.9–10.3)
Chloride: 95 mmol/L — ABNORMAL LOW (ref 98–111)
Creatinine, Ser: 0.88 mg/dL (ref 0.44–1.00)
GFR calc Af Amer: >60 mL/min (ref >60)
GFR calc non Af Amer: >60 mL/min (ref >60)
Glucose, Bld: 115 mg/dL — ABNORMAL HIGH (ref 70–99)
Potassium: 4.3 mmol/L (ref 3.5–5.1)
Sodium: 129 mmol/L — ABNORMAL LOW (ref 135–145)
Total Bilirubin: 0.5 mg/dL (ref 0.3–1.2)
Total Protein: 7.4 g/dL (ref 6.5–8.1)

## 2019-06-03 LAB — TROPONIN I (HIGH SENSITIVITY)
Troponin I (High Sensitivity): 10 ng/L (ref <18)
Troponin I (High Sensitivity): 11 ng/L (ref <18)

## 2019-06-03 LAB — LIPASE, BLOOD: Lipase: 200 U/L — ABNORMAL HIGH (ref 11–51)

## 2019-06-03 LAB — SARS CORONAVIRUS 2 BY RT PCR (HOSPITAL ORDER, PERFORMED IN ~~LOC~~ HOSPITAL LAB): SARS Coronavirus 2: NEGATIVE

## 2019-06-03 MED ORDER — IOHEXOL 350 MG/ML SOLN
100.0000 mL | Freq: Once | INTRAVENOUS | Status: AC | PRN
  Administered 2019-06-03: 100 mL via INTRAVENOUS

## 2019-06-03 MED ORDER — ONDANSETRON HCL 4 MG/2ML IJ SOLN
4.0000 mg | INTRAMUSCULAR | Status: DC | PRN
  Administered 2019-06-03: 4 mg via INTRAVENOUS
  Filled 2019-06-03: qty 2

## 2019-06-03 MED ORDER — CIPROFLOXACIN HCL 250 MG PO TABS
500.0000 mg | ORAL_TABLET | Freq: Once | ORAL | Status: AC
  Administered 2019-06-03: 500 mg via ORAL
  Filled 2019-06-03: qty 2

## 2019-06-03 MED ORDER — CIPROFLOXACIN HCL 500 MG PO TABS: 500.0000 mg | ORAL_TABLET | Freq: Two times a day (BID) | ORAL | 0 refills | Status: AC

## 2019-06-03 MED ORDER — MORPHINE SULFATE (PF) 4 MG/ML IV SOLN
4.0000 mg | INTRAVENOUS | Status: DC | PRN
  Administered 2019-06-03: 4 mg via INTRAVENOUS
  Filled 2019-06-03: qty 1

## 2019-06-03 MED ORDER — OXYCODONE-ACETAMINOPHEN 5-325 MG PO TABS
2.0000 | ORAL_TABLET | Freq: Once | ORAL | Status: AC
  Administered 2019-06-03: 15:00:00 2 via ORAL
  Filled 2019-06-03: qty 2

## 2019-06-03 MED ORDER — ONDANSETRON 4 MG PO TBDP: ORAL_TABLET | ORAL | 0 refills | Status: AC

## 2019-06-03 MED ORDER — OXYCODONE-ACETAMINOPHEN 5-325 MG PO TABS: 1.0000 | ORAL_TABLET | Freq: Four times a day (QID) | ORAL | 0 refills | Status: AC | PRN

## 2019-06-03 NOTE — ED Notes (Signed)
Signature pad will not work in rm 5  Pt verbalizes understanding of DC instructions, med regime and follow up   Call to son Jori Moll who is on the way

## 2019-06-03 NOTE — Discharge Instructions (Addendum)
Follow up with your md Wednesday as planned

## 2019-06-03 NOTE — ED Triage Notes (Signed)
Pt reports she began having pain in her left flank/back area yesterday afternoon suddenly without injury. Took Tylenol and muscle relaxer last night without relief. Is currently on radiation for "female cancer" and starts chemo this week.

## 2019-06-03 NOTE — ED Notes (Signed)
Patient states her left sided "big muscle" is hurting in her back. States her spine is not tender.

## 2019-06-03 NOTE — ED Notes (Addendum)
Administered supplemental oxygen via nasal cannula 2 L/min due to increased work of breathing. O2 saturation is 100% room air. Patient stated she feels anxious and thinks oxygen will help with anxiety.

## 2019-06-03 NOTE — ED Provider Notes (Signed)
[facility] EMERGENCY DEPARTMENT Provider Note   CSN: [id number] Arrival date & time: 06/03/19  2458     History   Chief Complaint Chief Complaint  Patient presents with   Back Pain    HPI Nicole Cochran is a 84 y.o. female.     HPI Pt was seen at 1120. Per pt, c/o gradual onset and worsening of persistent left sided back "pains" that began yesterday afternoon. Pt describes the pain as "spasms" and located in her left posterior ribs/back and flank areas. Pt states she took APAP and "muscle relaxer" yesterday without improvement. Pain worsens with palpation of the area and movement. Pt endorses hx of "female cancer" and due to start chemo this week. Denies CP/palpitations, no cough, no abd pain, no N/V/D, no rash, no fevers, no injury. Denies incont/retention of bowel or bladder, no saddle anesthesia, no focal motor weakness, no tingling/numbness in extremities.    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   Osteoarthritis of right knee 04/17/2013   High cholesterol 11/02/2011   Crohn disease ([place]) 11/02/2011   Arthritis 11/02/2011   Hypertension 11/02/2011    Past Surgical History:  Procedure Laterality Date   ABDOMINAL HYSTERECTOMY     APPENDECTOMY     CATARACT EXTRACTION Bilateral 2009   COLONOSCOPY  2011   @ [place]     HYSTEROSCOPY W/D&C N/A 02/23/2018   Procedure: DILATATION AND CURETTAGE /HYSTEROSCOPY;  Surgeon: Jonnie Kind, MD;  Location: [facility] OR[facility];  Service: Gynecology;  Laterality: N/A;   ILEOSTOMY  2002   LAPAROTOMY N/A 06/04/2013   Procedure: EXPLORATORY LAPAROTOMY;  Surgeon: Jamesetta So, MD;  Location: [facility] ORS;   Service: General;  Laterality: N/A;   parastomal hernia repair   LYSIS OF ADHESION N/A 06/04/2013   Procedure: LYSIS OF ADHESION;  Surgeon: Jamesetta So, MD;  Location: [facility] OR[facility];  Service: General;  Laterality: N/A;   NASAL SINUS SURGERY     POLYPECTOMY N/A 02/23/2018   Procedure: ENDOMETRIAL POLYPECTOMY;  Surgeon: Jonnie Kind, MD;  Location: [facility] ORS;  Service: Gynecology;  Laterality: N/A;   SUBTOTAL COLECTOMY  2002   TUBAL LIGATION       OB History    Gravida  4   Para  4   Term  4   Preterm      AB      Living  3     SAB      TAB      Ectopic      Multiple      Live Births  4            Home Medications    Prior to Admission medications   Medication Sig Start Date End Date Taking? Authorizing Provider  acetaminophen (TYLENOL) 500 MG tablet Take 500 mg by mouth every 8 (eight) hours as needed for mild pain or moderate pain.     [provider]  amLODipine (NORVASC) 5 MG tablet Take 5 mg by mouth daily.    [provider]  B  Complex Vitamins (VITAMIN B-COMPLEX) TABS Take 1 tablet by mouth daily.    [provider]  Cholecalciferol (VITAMIN D3) 1000 units CAPS Take 400 Units by mouth.    [provider]  cyanocobalamin 100 MCG tablet Take by mouth 2 (two) times daily after a meal.    [provider]  EUTHYROX 50 MCG tablet Take 1 tablet by mouth daily. 05/14/19   [provider]  ferrous sulfate 325 (65 FE) MG tablet Take 325 mg by mouth daily with breakfast.    [provider]  hydrochlorothiazide (MICROZIDE) 12.5 MG capsule Take 1 capsule by mouth daily. 05/18/19   [provider]  Multiple Vitamin (MULTIVITAMIN) tablet Take 1 tablet by mouth every morning.     [provider]  olmesartan-hydrochlorothiazide (BENICAR HCT) 40-12.5 MG tablet Take 1 tablet by mouth daily.    [provider]  Omega-3 Fatty Acids (FISH OIL) 1000 MG CAPS Take 1 capsule by mouth daily.      [provider]    Family History History reviewed. No pertinent family history.  Social History Social History   Tobacco Use   Smoking status: Never Smoker   Smokeless tobacco: Never Used  Substance Use Topics   Alcohol use: No    Alcohol/week: 0.0 standard drinks   Drug use: No     Allergies   Penicillins and Tape   Review of Systems Review of Systems ROS: Statement: All systems negative except as marked or noted in the HPI; Constitutional: Negative for fever and chills. ; ; Eyes: Negative for eye pain, redness and discharge. ; ; ENMT: Negative for ear pain, hoarseness, nasal congestion, sinus pressure and sore throat. ; ; Cardiovascular: Negative for chest pain, palpitations, diaphoresis, dyspnea and peripheral edema. ; ; Respiratory: Negative for cough, wheezing and stridor. ; ; Gastrointestinal: Negative for nausea, vomiting, diarrhea, abdominal pain, blood in stool, hematemesis, jaundice and rectal bleeding. . ; ; Genitourinary: Negative for dysuria, flank pain and hematuria. ; ; Musculoskeletal: +back pain. Negative for neck pain. Negative for swelling and trauma.; ; Skin: Negative for pruritus, rash, abrasions, blisters, bruising and skin lesion.; ; Neuro: Negative for headache, lightheadedness and neck stiffness. Negative for weakness, altered level of consciousness, altered mental status, extremity weakness, paresthesias, involuntary movement, seizure and syncope.       Physical Exam Updated Vital Signs BP (!) 153/86 (BP Location: Right Arm)    Pulse 63    Temp 98.7 F (37.1 C) (Oral)    Resp 20    Ht 5' 2"  (1.575 m)    Wt 77.1 kg    SpO2 100%    BMI 31.09 kg/m    BP (!) 155/75    Pulse 66    Temp 98.7 F (37.1 C) (Oral)    Resp (!) 26    Ht 5' 2"  (1.575 m)    Wt 77.1 kg    SpO2 100%    BMI 31.09 kg/m    Physical Exam 1125: Physical examination:  Nursing notes reviewed; Vital signs and O2 SAT reviewed;  Constitutional: Well developed, Well nourished,  Well hydrated, Uncomfortable appearing.; Head:  Normocephalic, atraumatic; Eyes: EOMI, PERRL, No scleral icterus; ENMT: Mouth and pharynx normal, Mucous membranes moist; Neck: Supple, Full range of motion, No lymphadenopathy; Cardiovascular: Regular rate and rhythm, No gallop; Respiratory: Breath sounds clear & equal bilaterally, No wheezes.  Speaking full sentences with ease, Normal respiratory effort/excursion; Chest: Nontender, Movement normal; Abdomen: Soft, Nontender, Nondistended, Normal bowel sounds; Genitourinary: No CVA  tenderness; Spine:  No midline CS, TS, LS tenderness. +TTP left posterior ribs, thoracic and upper lumbar paraspinal muscles. No rash.;;   Extremities: Peripheral pulses normal, Pelvis stable. No tenderness, No edema, No calf edema or asymmetry.; Neuro: AA&Ox3, Major CN grossly intact.  Speech clear. No gross focal motor or sensory deficits in extremities.; Skin: Color normal, Warm, Dry.; Psych:  Anxious.     ED Treatments / Results  Labs (all labs ordered are listed, but only abnormal results are displayed)   EKG EKG Interpretation  Date/Time:  Sunday June 03 2019 12:15:15 EDT Ventricular Rate:  68 PR Interval:    QRS Duration: 79 QT Interval:  395 QTC Calculation: 417 R Axis:   24 Text Interpretation:  Sinus rhythm Prolonged PR interval Low voltage, precordial leads Baseline wander When compared with ECG of 02/22/2018 No significant change was found Confirmed by Francine Graven [phone]) on 06/03/2019 12:43:22 PM   Radiology   Procedures Procedures (including critical care time)  Medications Ordered in ED Medications  morphine 4 MG/ML injection 4 mg (has no administration in time range)  ondansetron (ZOFRAN) injection 4 mg (has no administration in time range)     Initial Impression / Assessment and Plan / ED Course  I have reviewed the triage vital signs and the nursing notes.  Pertinent labs & imaging results that were available during my care of the  patient were reviewed by me and considered in my medical decision making (see chart for details).     MDM Reviewed: previous chart, nursing note and vitals Reviewed previous: labs and ECG Interpretation: labs, ECG and CT scan   Results for orders placed or performed during the hospital encounter of 06/03/19  Troponin I (High Sensitivity)  Result Value Ref Range   Troponin I (High Sensitivity) 10.00 <18 ng/L  CBC with Differential  Result Value Ref Range   WBC 6.8 4.0 - 10.5 K/uL   RBC 3.48 (L) 3.87 - 5.11 MIL/uL   Hemoglobin 10.2 (L) 12.0 - 15.0 g/dL   HCT 29.3 (L) 36.0 - 46.0 %   MCV 84.2 80.0 - 100.0 fL   MCH 29.3 26.0 - 34.0 pg   MCHC 34.8 30.0 - 36.0 g/dL   RDW 14.6 11.5 - 15.5 %   Platelets 239 150 - 400 K/uL   nRBC 0.0 0.0 - 0.2 %   Neutrophils Relative % 86 %   Neutro Abs 5.8 1.7 - 7.7 K/uL   Lymphocytes Relative 8 %   Lymphs Abs 0.5 (L) 0.7 - 4.0 K/uL   Monocytes Relative 6 %   Monocytes Absolute 0.4 0.1 - 1.0 K/uL   Eosinophils Relative 0 %   Eosinophils Absolute 0.0 0.0 - 0.5 K/uL   Basophils Relative 0 %   Basophils Absolute 0.0 0.0 - 0.1 K/uL   Immature Granulocytes 0 %   Abs Immature Granulocytes 0.03 0.00 - 0.07 K/uL  Comprehensive metabolic panel  Result Value Ref Range   Sodium 129 (L) 135 - 145 mmol/L   Potassium 4.3 3.5 - 5.1 mmol/L   Chloride 95 (L) 98 - 111 mmol/L   CO2 21 (L) 22 - 32 mmol/L   Glucose, Bld 115 (H) 70 - 99 mg/dL   BUN 25 (H) 8 - 23 mg/dL   Creatinine, Ser 0.88 0.44 - 1.00 mg/dL   Calcium 9.7 8.9 - 10.3 mg/dL   Total Protein 7.4 6.5 - 8.1 g/dL   Albumin 3.7 3.5 - 5.0 g/dL   AST 31 15 - 41  U/L   ALT 35 0 - 44 U/L   Alkaline Phosphatase 128 (H) 38 - 126 U/L   Total Bilirubin 0.5 0.3 - 1.2 mg/dL   GFR calc non Af Amer >60 >60 mL/min   GFR calc Af Amer >60 >60 mL/min   Anion gap 13 5 - 15  Lipase, blood  Result Value Ref Range   Lipase 200 (H) 11 - 51 U/L   Ct Angio Chest Pe W/cm &/or Wo Cm Result Date: 06/03/2019 CLINICAL  DATA:  Acute left flank pain. EXAM: CT ANGIOGRAPHY CHEST CT ABDOMEN AND PELVIS WITH CONTRAST TECHNIQUE: Multidetector CT imaging of the chest was performed using the standard protocol during bolus administration of intravenous contrast. Multiplanar CT image reconstructions and MIPs were obtained to evaluate the vascular anatomy. Multidetector CT imaging of the abdomen and pelvis was performed using the standard protocol during bolus administration of intravenous contrast. CONTRAST:  178m OMNIPAQUE IOHEXOL 350 MG/ML SOLN COMPARISON:  CT scan of June 02, 2013. FINDINGS: CTA CHEST FINDINGS Cardiovascular: Satisfactory opacification of the pulmonary arteries to the segmental level. No evidence of pulmonary embolism. Normal heart size. No pericardial effusion. Atherosclerosis of thoracic aorta is noted without aneurysm or dissection. Mediastinum/Nodes: No enlarged mediastinal, hilar, or axillary lymph nodes. Thyroid gland, trachea, and esophagus demonstrate no significant findings. Lungs/Pleura: No pneumothorax or pleural effusion is noted. Multiple rounded nodules are noted throughout both lungs, some of which are cavitary, consistent with metastatic disease. The largest measures 1 cm in the right lower lobe. Musculoskeletal: No chest wall abnormality. No acute or significant osseous findings. Review of the MIP images confirms the above findings. CT ABDOMEN and PELVIS FINDINGS Hepatobiliary: Multiple rounded masses are noted in the liver consistent with hepatic metastases. The largest measures 6.7 cm in the left hepatic lobe. Pancreas: Unremarkable. No pancreatic ductal dilatation or surrounding inflammatory changes. Spleen: Normal in size without focal abnormality. Adrenals/Urinary Tract: Adrenal glands appear normal. Severe left hydronephrosis is noted which appears to be due to external compression of the proximal left ureter by 6.3 x 3.2 cm retroperitoneal mass consistent with metastatic disease. Severe right  hydronephrosis is noted with dilatation of the proximal right ureter, due to obstruction of indeterminate etiology. Urinary bladder is somewhat patulous with herniation of a portion through the ventral wall of the pelvis. Stomach/Bowel: The stomach appears normal. Status post colectomy. Ileostomy is noted in the right lower quadrant with large peristomal hernia. There is no evidence of bowel obstruction. Vascular/Lymphatic: Atherosclerosis of thoracic aorta is noted without aneurysm formation. 14 mm right periaortic lymph node is noted. Previously described left periaortic mass is noted. Reproductive: Status post hysterectomy. 13.6 x 13.4 x 10.6 cm cystic structure is seen arising from the pelvis into the mid abdomen of unknown etiology. Other: No abdominal wall hernia or abnormality. No abdominopelvic ascites. Musculoskeletal: 2.7 cm lytic lesion is seen in the left ischial tuberosity concerning for metastatic disease. Multiple other lytic lesions are noted in the iliac bones bilaterally. Review of the MIP images confirms the above findings. IMPRESSION: No definite evidence of pulmonary embolus. Multiple bilateral pulmonary nodules are noted, some of which are cystic, concerning for metastatic disease. Multiple hepatic metastatic lesions are noted, with the largest measuring 6.7 cm in the left hepatic lobe. Severe left hydronephrosis is noted which appears to be due to external compression of the proximal left ureter by 6.3 x 3.2 cm left retroperitoneal mass or lymph node. Severe right hydronephrosis is also noted with dilatation of proximal right ureter, due  to obstruction of indeterminate etiology. Distal right ureter is unremarkable. 13.6 x 13.4 x 10.6 cm cystic structure is seen arising from the pelvis into the mid abdomen of uncertain etiology. This may be ovarian etiology, it was not present on prior exam. Multiple metastatic lesions are noted in the iliac bones bilaterally. Herniation of a portion of the  urinary bladder through the ventral wall the pelvis is noted. Status post colectomy with ileostomy seen in the right lower quadrant with large peristomal hernia. No definite evidence of bowel obstruction. 14 mm right periaortic lymph node is noted consistent with metastatic disease. Aortic Atherosclerosis (ICD10-I70.0). Electronically Signed   By: Marijo Conception M.D.   On: 06/03/2019 14:34   Ct Abdomen Pelvis W Contrast Result Date: 06/03/2019 CLINICAL DATA:  Acute left flank pain. EXAM: CT ANGIOGRAPHY CHEST CT ABDOMEN AND PELVIS WITH CONTRAST TECHNIQUE: Multidetector CT imaging of the chest was performed using the standard protocol during bolus administration of intravenous contrast. Multiplanar CT image reconstructions and MIPs were obtained to evaluate the vascular anatomy. Multidetector CT imaging of the abdomen and pelvis was performed using the standard protocol during bolus administration of intravenous contrast. CONTRAST:  1107m OMNIPAQUE IOHEXOL 350 MG/ML SOLN COMPARISON:  CT scan of June 02, 2013. FINDINGS: CTA CHEST FINDINGS Cardiovascular: Satisfactory opacification of the pulmonary arteries to the segmental level. No evidence of pulmonary embolism. Normal heart size. No pericardial effusion. Atherosclerosis of thoracic aorta is noted without aneurysm or dissection. Mediastinum/Nodes: No enlarged mediastinal, hilar, or axillary lymph nodes. Thyroid gland, trachea, and esophagus demonstrate no significant findings. Lungs/Pleura: No pneumothorax or pleural effusion is noted. Multiple rounded nodules are noted throughout both lungs, some of which are cavitary, consistent with metastatic disease. The largest measures 1 cm in the right lower lobe. Musculoskeletal: No chest wall abnormality. No acute or significant osseous findings. Review of the MIP images confirms the above findings. CT ABDOMEN and PELVIS FINDINGS Hepatobiliary: Multiple rounded masses are noted in the liver consistent with hepatic  metastases. The largest measures 6.7 cm in the left hepatic lobe. Pancreas: Unremarkable. No pancreatic ductal dilatation or surrounding inflammatory changes. Spleen: Normal in size without focal abnormality. Adrenals/Urinary Tract: Adrenal glands appear normal. Severe left hydronephrosis is noted which appears to be due to external compression of the proximal left ureter by 6.3 x 3.2 cm retroperitoneal mass consistent with metastatic disease. Severe right hydronephrosis is noted with dilatation of the proximal right ureter, due to obstruction of indeterminate etiology. Urinary bladder is somewhat patulous with herniation of a portion through the ventral wall of the pelvis. Stomach/Bowel: The stomach appears normal. Status post colectomy. Ileostomy is noted in the right lower quadrant with large peristomal hernia. There is no evidence of bowel obstruction. Vascular/Lymphatic: Atherosclerosis of thoracic aorta is noted without aneurysm formation. 14 mm right periaortic lymph node is noted. Previously described left periaortic mass is noted. Reproductive: Status post hysterectomy. 13.6 x 13.4 x 10.6 cm cystic structure is seen arising from the pelvis into the mid abdomen of unknown etiology. Other: No abdominal wall hernia or abnormality. No abdominopelvic ascites. Musculoskeletal: 2.7 cm lytic lesion is seen in the left ischial tuberosity concerning for metastatic disease. Multiple other lytic lesions are noted in the iliac bones bilaterally. Review of the MIP images confirms the above findings. IMPRESSION: No definite evidence of pulmonary embolus. Multiple bilateral pulmonary nodules are noted, some of which are cystic, concerning for metastatic disease. Multiple hepatic metastatic lesions are noted, with the largest measuring 6.7 cm in the  left hepatic lobe. Severe left hydronephrosis is noted which appears to be due to external compression of the proximal left ureter by 6.3 x 3.2 cm left retroperitoneal mass or  lymph node. Severe right hydronephrosis is also noted with dilatation of proximal right ureter, due to obstruction of indeterminate etiology. Distal right ureter is unremarkable. 13.6 x 13.4 x 10.6 cm cystic structure is seen arising from the pelvis into the mid abdomen of uncertain etiology. This may be ovarian etiology, it was not present on prior exam. Multiple metastatic lesions are noted in the iliac bones bilaterally. Herniation of a portion of the urinary bladder through the ventral wall the pelvis is noted. Status post colectomy with ileostomy seen in the right lower quadrant with large peristomal hernia. No definite evidence of bowel obstruction. 14 mm right periaortic lymph node is noted consistent with metastatic disease. Aortic Atherosclerosis (ICD10-I70.0). Electronically Signed   By: Marijo Conception M.D.   On: 06/03/2019 14:34    Results for Nicole, Cochran (MRN [id number]) as of 06/03/2019 14:42  Ref. Range 06/07/2013 06:31 06/08/2013 05:39 02/22/2018 16:00 02/28/2018 11:17 06/03/2019 12:22  Hemoglobin Latest Ref Range: 12.0 - 15.0 g/dL 12.1 12.2 10.1 (L) 9.7 (L) 10.2 (L)  HCT Latest Ref Range: 36.0 - 46.0 % 35.2 (L) 35.4 (L) 29.8 (L) 28.8 (L) 29.3 (L)    Results for Nicole, Cochran (MRN [id number]) as of 06/03/2019 14:42  Ref. Range 06/08/2013 05:39 06/09/2013 06:09 02/22/2018 16:00 02/28/2018 11:17 06/03/2019 12:22  Sodium Latest Ref Range: 135 - 145 mmol/L 137 133 (L) 123 (L) 127 (L) 129 (L)     1450:  CT as above. BUN/Cr per baseline. Na, H/H also per baseline.  T/C returned from Uro Dr. Junious Silk, case discussed, including:  HPI, pertinent PM/SHx, VS/PE, dx testing, ED course and treatment:  States with normal BUN/Cr there is no acute intervention needed at this time, pt does not need admission from Uro standpoint, pt will need to f/u with her Onc MD and Uro MD at [facility] for further treatment decisions/options available.   1515:  Dx and testing, as well as incidental finding(s) and d/w Uro MD, d/w pt.   Questions answered.  Verb understanding. Pt does not want to be admitted to the hospital and is requesting pain control and d/c home so she can f/u with her Onc MD at [facility] on Wednesday. Will trial PO percocet. UA/UC pending. Sign out to Dr. Roderic Palau.     Final Clinical Impressions(s) / ED Diagnoses   Final diagnoses:  None    ED Discharge Orders    None       Francine Graven, DO 06/03/19 1521

## 2019-06-04 DIAGNOSIS — C7951 Secondary malignant neoplasm of bone: Secondary | ICD-10-CM | POA: Diagnosis not present

## 2019-06-04 DIAGNOSIS — C55 Malignant neoplasm of uterus, part unspecified: Secondary | ICD-10-CM | POA: Diagnosis not present

## 2019-06-04 LAB — URINE CULTURE: Culture: NO GROWTH

## 2019-06-06 DIAGNOSIS — Z79899 Other long term (current) drug therapy: Secondary | ICD-10-CM | POA: Diagnosis not present

## 2019-06-06 DIAGNOSIS — C7989 Secondary malignant neoplasm of other specified sites: Secondary | ICD-10-CM | POA: Diagnosis not present

## 2019-06-06 DIAGNOSIS — C55 Malignant neoplasm of uterus, part unspecified: Secondary | ICD-10-CM | POA: Diagnosis not present

## 2019-06-06 DIAGNOSIS — C7951 Secondary malignant neoplasm of bone: Secondary | ICD-10-CM | POA: Diagnosis not present

## 2019-06-08 DIAGNOSIS — Z79899 Other long term (current) drug therapy: Secondary | ICD-10-CM | POA: Diagnosis not present

## 2019-06-08 DIAGNOSIS — C55 Malignant neoplasm of uterus, part unspecified: Secondary | ICD-10-CM | POA: Diagnosis not present

## 2019-06-08 DIAGNOSIS — Z5111 Encounter for antineoplastic chemotherapy: Secondary | ICD-10-CM | POA: Diagnosis not present

## 2019-06-08 DIAGNOSIS — C7951 Secondary malignant neoplasm of bone: Secondary | ICD-10-CM | POA: Diagnosis not present

## 2019-06-14 DIAGNOSIS — W19XXXD Unspecified fall, subsequent encounter: Secondary | ICD-10-CM | POA: Diagnosis not present

## 2019-06-14 DIAGNOSIS — C7951 Secondary malignant neoplasm of bone: Secondary | ICD-10-CM | POA: Diagnosis not present

## 2019-06-14 DIAGNOSIS — I890 Lymphedema, not elsewhere classified: Secondary | ICD-10-CM | POA: Diagnosis not present

## 2019-06-14 DIAGNOSIS — C55 Malignant neoplasm of uterus, part unspecified: Secondary | ICD-10-CM | POA: Diagnosis not present

## 2019-06-14 DIAGNOSIS — I159 Secondary hypertension, unspecified: Secondary | ICD-10-CM | POA: Diagnosis not present

## 2019-06-19 DIAGNOSIS — M1990 Unspecified osteoarthritis, unspecified site: Secondary | ICD-10-CM | POA: Diagnosis not present

## 2019-06-19 DIAGNOSIS — Z8542 Personal history of malignant neoplasm of other parts of uterus: Secondary | ICD-10-CM | POA: Diagnosis not present

## 2019-06-19 DIAGNOSIS — I159 Secondary hypertension, unspecified: Secondary | ICD-10-CM | POA: Diagnosis not present

## 2019-06-19 DIAGNOSIS — R918 Other nonspecific abnormal finding of lung field: Secondary | ICD-10-CM | POA: Diagnosis not present

## 2019-06-19 DIAGNOSIS — Z9181 History of falling: Secondary | ICD-10-CM | POA: Diagnosis not present

## 2019-06-19 DIAGNOSIS — C787 Secondary malignant neoplasm of liver and intrahepatic bile duct: Secondary | ICD-10-CM | POA: Diagnosis not present

## 2019-06-19 DIAGNOSIS — C7951 Secondary malignant neoplasm of bone: Secondary | ICD-10-CM | POA: Diagnosis not present

## 2019-06-19 DIAGNOSIS — E7800 Pure hypercholesterolemia, unspecified: Secondary | ICD-10-CM | POA: Diagnosis not present

## 2019-06-19 DIAGNOSIS — Z932 Ileostomy status: Secondary | ICD-10-CM | POA: Diagnosis not present

## 2019-06-19 DIAGNOSIS — I890 Lymphedema, not elsewhere classified: Secondary | ICD-10-CM | POA: Diagnosis not present

## 2019-06-19 DIAGNOSIS — G629 Polyneuropathy, unspecified: Secondary | ICD-10-CM | POA: Diagnosis not present

## 2019-06-19 DIAGNOSIS — K8051 Calculus of bile duct without cholangitis or cholecystitis with obstruction: Secondary | ICD-10-CM | POA: Diagnosis not present

## 2019-06-19 DIAGNOSIS — W19XXXD Unspecified fall, subsequent encounter: Secondary | ICD-10-CM | POA: Diagnosis not present

## 2019-06-19 DIAGNOSIS — G893 Neoplasm related pain (acute) (chronic): Secondary | ICD-10-CM | POA: Diagnosis not present

## 2019-06-19 DIAGNOSIS — Z933 Colostomy status: Secondary | ICD-10-CM | POA: Diagnosis not present

## 2019-06-21 DIAGNOSIS — G629 Polyneuropathy, unspecified: Secondary | ICD-10-CM | POA: Diagnosis not present

## 2019-06-21 DIAGNOSIS — I890 Lymphedema, not elsewhere classified: Secondary | ICD-10-CM | POA: Diagnosis not present

## 2019-06-21 DIAGNOSIS — E7800 Pure hypercholesterolemia, unspecified: Secondary | ICD-10-CM | POA: Diagnosis not present

## 2019-06-21 DIAGNOSIS — R918 Other nonspecific abnormal finding of lung field: Secondary | ICD-10-CM | POA: Diagnosis not present

## 2019-06-21 DIAGNOSIS — W19XXXD Unspecified fall, subsequent encounter: Secondary | ICD-10-CM | POA: Diagnosis not present

## 2019-06-21 DIAGNOSIS — M1990 Unspecified osteoarthritis, unspecified site: Secondary | ICD-10-CM | POA: Diagnosis not present

## 2019-06-21 DIAGNOSIS — Z8542 Personal history of malignant neoplasm of other parts of uterus: Secondary | ICD-10-CM | POA: Diagnosis not present

## 2019-06-21 DIAGNOSIS — I159 Secondary hypertension, unspecified: Secondary | ICD-10-CM | POA: Diagnosis not present

## 2019-06-21 DIAGNOSIS — C7951 Secondary malignant neoplasm of bone: Secondary | ICD-10-CM | POA: Diagnosis not present

## 2019-06-21 DIAGNOSIS — C787 Secondary malignant neoplasm of liver and intrahepatic bile duct: Secondary | ICD-10-CM | POA: Diagnosis not present

## 2019-06-21 DIAGNOSIS — Z9181 History of falling: Secondary | ICD-10-CM | POA: Diagnosis not present

## 2019-06-21 DIAGNOSIS — G893 Neoplasm related pain (acute) (chronic): Secondary | ICD-10-CM | POA: Diagnosis not present

## 2019-06-22 DIAGNOSIS — D649 Anemia, unspecified: Secondary | ICD-10-CM | POA: Diagnosis not present

## 2019-06-22 DIAGNOSIS — C787 Secondary malignant neoplasm of liver and intrahepatic bile duct: Secondary | ICD-10-CM | POA: Diagnosis not present

## 2019-06-22 DIAGNOSIS — K229 Disease of esophagus, unspecified: Secondary | ICD-10-CM | POA: Diagnosis not present

## 2019-06-22 DIAGNOSIS — R935 Abnormal findings on diagnostic imaging of other abdominal regions, including retroperitoneum: Secondary | ICD-10-CM | POA: Diagnosis not present

## 2019-06-22 DIAGNOSIS — Z8679 Personal history of other diseases of the circulatory system: Secondary | ICD-10-CM | POA: Diagnosis not present

## 2019-06-22 DIAGNOSIS — Z90710 Acquired absence of both cervix and uterus: Secondary | ICD-10-CM | POA: Diagnosis not present

## 2019-06-22 DIAGNOSIS — R1032 Left lower quadrant pain: Secondary | ICD-10-CM | POA: Diagnosis not present

## 2019-06-22 DIAGNOSIS — Z933 Colostomy status: Secondary | ICD-10-CM | POA: Diagnosis not present

## 2019-06-22 DIAGNOSIS — K5090 Crohn's disease, unspecified, without complications: Secondary | ICD-10-CM | POA: Diagnosis not present

## 2019-06-22 DIAGNOSIS — C55 Malignant neoplasm of uterus, part unspecified: Secondary | ICD-10-CM | POA: Diagnosis not present

## 2019-06-22 DIAGNOSIS — C7951 Secondary malignant neoplasm of bone: Secondary | ICD-10-CM | POA: Diagnosis not present

## 2019-06-22 DIAGNOSIS — M545 Low back pain: Secondary | ICD-10-CM | POA: Diagnosis not present

## 2019-06-22 DIAGNOSIS — G893 Neoplasm related pain (acute) (chronic): Secondary | ICD-10-CM | POA: Diagnosis not present

## 2019-06-22 DIAGNOSIS — D72819 Decreased white blood cell count, unspecified: Secondary | ICD-10-CM | POA: Diagnosis not present

## 2019-06-22 DIAGNOSIS — I10 Essential (primary) hypertension: Secondary | ICD-10-CM | POA: Diagnosis not present

## 2019-06-22 DIAGNOSIS — E871 Hypo-osmolality and hyponatremia: Secondary | ICD-10-CM | POA: Diagnosis not present

## 2019-06-27 DIAGNOSIS — C55 Malignant neoplasm of uterus, part unspecified: Secondary | ICD-10-CM | POA: Diagnosis not present

## 2019-06-27 DIAGNOSIS — C549 Malignant neoplasm of corpus uteri, unspecified: Secondary | ICD-10-CM | POA: Diagnosis not present

## 2019-06-27 DIAGNOSIS — Z79899 Other long term (current) drug therapy: Secondary | ICD-10-CM | POA: Diagnosis not present

## 2019-07-02 DIAGNOSIS — R52 Pain, unspecified: Secondary | ICD-10-CM | POA: Diagnosis not present

## 2019-07-02 DIAGNOSIS — Z5181 Encounter for therapeutic drug level monitoring: Secondary | ICD-10-CM | POA: Diagnosis not present

## 2019-07-02 DIAGNOSIS — R5381 Other malaise: Secondary | ICD-10-CM | POA: Diagnosis not present

## 2019-07-02 DIAGNOSIS — C7951 Secondary malignant neoplasm of bone: Secondary | ICD-10-CM | POA: Diagnosis not present

## 2019-07-02 DIAGNOSIS — C55 Malignant neoplasm of uterus, part unspecified: Secondary | ICD-10-CM | POA: Diagnosis not present

## 2019-07-02 DIAGNOSIS — E039 Hypothyroidism, unspecified: Secondary | ICD-10-CM | POA: Diagnosis not present

## 2019-07-02 DIAGNOSIS — R609 Edema, unspecified: Secondary | ICD-10-CM | POA: Diagnosis not present

## 2019-07-02 DIAGNOSIS — Z79891 Long term (current) use of opiate analgesic: Secondary | ICD-10-CM | POA: Diagnosis not present

## 2019-07-02 DIAGNOSIS — R918 Other nonspecific abnormal finding of lung field: Secondary | ICD-10-CM | POA: Diagnosis not present

## 2019-07-02 DIAGNOSIS — C787 Secondary malignant neoplasm of liver and intrahepatic bile duct: Secondary | ICD-10-CM | POA: Diagnosis not present

## 2019-07-02 DIAGNOSIS — Z7189 Other specified counseling: Secondary | ICD-10-CM | POA: Diagnosis not present

## 2019-07-02 DIAGNOSIS — Z79899 Other long term (current) drug therapy: Secondary | ICD-10-CM | POA: Diagnosis not present

## 2019-07-02 DIAGNOSIS — G629 Polyneuropathy, unspecified: Secondary | ICD-10-CM | POA: Diagnosis not present

## 2019-07-03 DIAGNOSIS — M1711 Unilateral primary osteoarthritis, right knee: Secondary | ICD-10-CM | POA: Diagnosis not present

## 2019-07-03 DIAGNOSIS — M545 Low back pain: Secondary | ICD-10-CM | POA: Diagnosis not present

## 2019-07-04 DIAGNOSIS — N1330 Unspecified hydronephrosis: Secondary | ICD-10-CM | POA: Diagnosis not present

## 2019-07-04 DIAGNOSIS — R944 Abnormal results of kidney function studies: Secondary | ICD-10-CM | POA: Diagnosis not present

## 2019-07-04 DIAGNOSIS — C7951 Secondary malignant neoplasm of bone: Secondary | ICD-10-CM | POA: Diagnosis not present

## 2019-07-04 DIAGNOSIS — C787 Secondary malignant neoplasm of liver and intrahepatic bile duct: Secondary | ICD-10-CM | POA: Diagnosis not present

## 2019-07-04 DIAGNOSIS — C55 Malignant neoplasm of uterus, part unspecified: Secondary | ICD-10-CM | POA: Diagnosis not present

## 2019-07-04 DIAGNOSIS — Z79899 Other long term (current) drug therapy: Secondary | ICD-10-CM | POA: Diagnosis not present

## 2019-07-09 DIAGNOSIS — Z452 Encounter for adjustment and management of vascular access device: Secondary | ICD-10-CM | POA: Diagnosis not present

## 2019-07-09 DIAGNOSIS — C55 Malignant neoplasm of uterus, part unspecified: Secondary | ICD-10-CM | POA: Diagnosis not present

## 2019-07-09 DIAGNOSIS — Z79899 Other long term (current) drug therapy: Secondary | ICD-10-CM | POA: Diagnosis not present

## 2019-07-13 DIAGNOSIS — Z9221 Personal history of antineoplastic chemotherapy: Secondary | ICD-10-CM | POA: Diagnosis not present

## 2019-07-13 DIAGNOSIS — D72819 Decreased white blood cell count, unspecified: Secondary | ICD-10-CM | POA: Diagnosis not present

## 2019-07-13 DIAGNOSIS — N390 Urinary tract infection, site not specified: Secondary | ICD-10-CM | POA: Diagnosis not present

## 2019-07-13 DIAGNOSIS — N1330 Unspecified hydronephrosis: Secondary | ICD-10-CM | POA: Diagnosis not present

## 2019-07-13 DIAGNOSIS — E669 Obesity, unspecified: Secondary | ICD-10-CM | POA: Diagnosis not present

## 2019-07-13 DIAGNOSIS — R300 Dysuria: Secondary | ICD-10-CM | POA: Diagnosis not present

## 2019-07-13 DIAGNOSIS — R339 Retention of urine, unspecified: Secondary | ICD-10-CM | POA: Diagnosis not present

## 2019-07-13 DIAGNOSIS — C55 Malignant neoplasm of uterus, part unspecified: Secondary | ICD-10-CM | POA: Diagnosis not present

## 2019-07-13 DIAGNOSIS — Z933 Colostomy status: Secondary | ICD-10-CM | POA: Diagnosis not present

## 2019-07-13 DIAGNOSIS — Z6831 Body mass index (BMI) 31.0-31.9, adult: Secondary | ICD-10-CM | POA: Diagnosis not present

## 2019-07-13 DIAGNOSIS — E871 Hypo-osmolality and hyponatremia: Secondary | ICD-10-CM | POA: Diagnosis not present

## 2019-07-14 DIAGNOSIS — R339 Retention of urine, unspecified: Secondary | ICD-10-CM | POA: Diagnosis not present

## 2019-07-14 DIAGNOSIS — N1330 Unspecified hydronephrosis: Secondary | ICD-10-CM | POA: Diagnosis not present

## 2019-07-16 DIAGNOSIS — R339 Retention of urine, unspecified: Secondary | ICD-10-CM | POA: Diagnosis not present

## 2019-07-16 DIAGNOSIS — Z933 Colostomy status: Secondary | ICD-10-CM | POA: Diagnosis not present

## 2019-07-25 DIAGNOSIS — Z79899 Other long term (current) drug therapy: Secondary | ICD-10-CM | POA: Diagnosis not present

## 2019-07-25 DIAGNOSIS — C55 Malignant neoplasm of uterus, part unspecified: Secondary | ICD-10-CM | POA: Diagnosis not present

## 2019-07-25 DIAGNOSIS — R339 Retention of urine, unspecified: Secondary | ICD-10-CM | POA: Diagnosis not present

## 2019-07-25 DIAGNOSIS — C787 Secondary malignant neoplasm of liver and intrahepatic bile duct: Secondary | ICD-10-CM | POA: Diagnosis not present

## 2019-07-25 DIAGNOSIS — R971 Elevated cancer antigen 125 [CA 125]: Secondary | ICD-10-CM | POA: Diagnosis not present

## 2019-07-25 DIAGNOSIS — E871 Hypo-osmolality and hyponatremia: Secondary | ICD-10-CM | POA: Diagnosis not present

## 2019-07-25 DIAGNOSIS — C7951 Secondary malignant neoplasm of bone: Secondary | ICD-10-CM | POA: Diagnosis not present

## 2019-07-30 DIAGNOSIS — Z932 Ileostomy status: Secondary | ICD-10-CM | POA: Diagnosis not present

## 2019-07-30 DIAGNOSIS — K8051 Calculus of bile duct without cholangitis or cholecystitis with obstruction: Secondary | ICD-10-CM | POA: Diagnosis not present

## 2019-07-30 DIAGNOSIS — Z933 Colostomy status: Secondary | ICD-10-CM | POA: Diagnosis not present

## 2019-07-31 DIAGNOSIS — C787 Secondary malignant neoplasm of liver and intrahepatic bile duct: Secondary | ICD-10-CM | POA: Diagnosis not present

## 2019-07-31 DIAGNOSIS — C55 Malignant neoplasm of uterus, part unspecified: Secondary | ICD-10-CM | POA: Diagnosis not present

## 2019-07-31 DIAGNOSIS — C7951 Secondary malignant neoplasm of bone: Secondary | ICD-10-CM | POA: Diagnosis not present

## 2019-07-31 DIAGNOSIS — Z515 Encounter for palliative care: Secondary | ICD-10-CM | POA: Diagnosis not present

## 2019-07-31 DIAGNOSIS — R5381 Other malaise: Secondary | ICD-10-CM | POA: Diagnosis not present

## 2019-08-07 DIAGNOSIS — R590 Localized enlarged lymph nodes: Secondary | ICD-10-CM | POA: Diagnosis not present

## 2019-08-07 DIAGNOSIS — C55 Malignant neoplasm of uterus, part unspecified: Secondary | ICD-10-CM | POA: Diagnosis not present

## 2019-08-07 DIAGNOSIS — R918 Other nonspecific abnormal finding of lung field: Secondary | ICD-10-CM | POA: Diagnosis not present

## 2019-08-07 DIAGNOSIS — K7689 Other specified diseases of liver: Secondary | ICD-10-CM | POA: Diagnosis not present

## 2019-08-14 DIAGNOSIS — I10 Essential (primary) hypertension: Secondary | ICD-10-CM | POA: Diagnosis not present

## 2019-08-14 DIAGNOSIS — C541 Malignant neoplasm of endometrium: Secondary | ICD-10-CM | POA: Diagnosis not present

## 2019-08-14 DIAGNOSIS — E782 Mixed hyperlipidemia: Secondary | ICD-10-CM | POA: Diagnosis not present

## 2019-08-14 DIAGNOSIS — R7301 Impaired fasting glucose: Secondary | ICD-10-CM | POA: Diagnosis not present

## 2019-08-14 DIAGNOSIS — D509 Iron deficiency anemia, unspecified: Secondary | ICD-10-CM | POA: Diagnosis not present

## 2019-08-15 DIAGNOSIS — E871 Hypo-osmolality and hyponatremia: Secondary | ICD-10-CM | POA: Diagnosis not present

## 2019-08-15 DIAGNOSIS — Z7901 Long term (current) use of anticoagulants: Secondary | ICD-10-CM | POA: Diagnosis not present

## 2019-08-15 DIAGNOSIS — I82890 Acute embolism and thrombosis of other specified veins: Secondary | ICD-10-CM | POA: Diagnosis not present

## 2019-08-15 DIAGNOSIS — C787 Secondary malignant neoplasm of liver and intrahepatic bile duct: Secondary | ICD-10-CM | POA: Diagnosis not present

## 2019-08-15 DIAGNOSIS — C7951 Secondary malignant neoplasm of bone: Secondary | ICD-10-CM | POA: Diagnosis not present

## 2019-08-15 DIAGNOSIS — C55 Malignant neoplasm of uterus, part unspecified: Secondary | ICD-10-CM | POA: Diagnosis not present

## 2019-08-15 DIAGNOSIS — R971 Elevated cancer antigen 125 [CA 125]: Secondary | ICD-10-CM | POA: Diagnosis not present

## 2019-08-15 DIAGNOSIS — R339 Retention of urine, unspecified: Secondary | ICD-10-CM | POA: Diagnosis not present

## 2019-08-15 DIAGNOSIS — Z79899 Other long term (current) drug therapy: Secondary | ICD-10-CM | POA: Diagnosis not present

## 2019-08-22 DIAGNOSIS — R339 Retention of urine, unspecified: Secondary | ICD-10-CM | POA: Diagnosis not present

## 2019-08-22 DIAGNOSIS — E871 Hypo-osmolality and hyponatremia: Secondary | ICD-10-CM | POA: Diagnosis not present

## 2019-08-22 DIAGNOSIS — C55 Malignant neoplasm of uterus, part unspecified: Secondary | ICD-10-CM | POA: Diagnosis not present

## 2019-08-22 DIAGNOSIS — Z466 Encounter for fitting and adjustment of urinary device: Secondary | ICD-10-CM | POA: Diagnosis not present

## 2019-09-05 DIAGNOSIS — Z5111 Encounter for antineoplastic chemotherapy: Secondary | ICD-10-CM | POA: Diagnosis not present

## 2019-09-05 DIAGNOSIS — C787 Secondary malignant neoplasm of liver and intrahepatic bile duct: Secondary | ICD-10-CM | POA: Diagnosis not present

## 2019-09-05 DIAGNOSIS — R3914 Feeling of incomplete bladder emptying: Secondary | ICD-10-CM | POA: Diagnosis not present

## 2019-09-05 DIAGNOSIS — C7951 Secondary malignant neoplasm of bone: Secondary | ICD-10-CM | POA: Diagnosis not present

## 2019-09-05 DIAGNOSIS — Z79899 Other long term (current) drug therapy: Secondary | ICD-10-CM | POA: Diagnosis not present

## 2019-09-05 DIAGNOSIS — C55 Malignant neoplasm of uterus, part unspecified: Secondary | ICD-10-CM | POA: Diagnosis not present

## 2019-09-05 DIAGNOSIS — E871 Hypo-osmolality and hyponatremia: Secondary | ICD-10-CM | POA: Diagnosis not present

## 2019-09-05 DIAGNOSIS — I82890 Acute embolism and thrombosis of other specified veins: Secondary | ICD-10-CM | POA: Diagnosis not present

## 2019-09-05 DIAGNOSIS — R0602 Shortness of breath: Secondary | ICD-10-CM | POA: Diagnosis not present

## 2019-09-05 DIAGNOSIS — I898 Other specified noninfective disorders of lymphatic vessels and lymph nodes: Secondary | ICD-10-CM | POA: Diagnosis not present

## 2019-09-05 DIAGNOSIS — G629 Polyneuropathy, unspecified: Secondary | ICD-10-CM | POA: Diagnosis not present

## 2019-09-05 DIAGNOSIS — Z7901 Long term (current) use of anticoagulants: Secondary | ICD-10-CM | POA: Diagnosis not present

## 2019-09-14 DIAGNOSIS — Z933 Colostomy status: Secondary | ICD-10-CM | POA: Diagnosis not present

## 2019-09-14 DIAGNOSIS — Z932 Ileostomy status: Secondary | ICD-10-CM | POA: Diagnosis not present

## 2019-09-14 DIAGNOSIS — K8051 Calculus of bile duct without cholangitis or cholecystitis with obstruction: Secondary | ICD-10-CM | POA: Diagnosis not present

## 2019-09-17 DIAGNOSIS — D509 Iron deficiency anemia, unspecified: Secondary | ICD-10-CM | POA: Diagnosis not present

## 2019-09-17 DIAGNOSIS — E782 Mixed hyperlipidemia: Secondary | ICD-10-CM | POA: Diagnosis not present

## 2019-09-17 DIAGNOSIS — I10 Essential (primary) hypertension: Secondary | ICD-10-CM | POA: Diagnosis not present

## 2019-09-17 DIAGNOSIS — R7301 Impaired fasting glucose: Secondary | ICD-10-CM | POA: Diagnosis not present

## 2019-09-17 DIAGNOSIS — C541 Malignant neoplasm of endometrium: Secondary | ICD-10-CM | POA: Diagnosis not present

## 2019-09-17 DIAGNOSIS — E039 Hypothyroidism, unspecified: Secondary | ICD-10-CM | POA: Diagnosis not present

## 2019-09-18 DIAGNOSIS — R7301 Impaired fasting glucose: Secondary | ICD-10-CM | POA: Diagnosis not present

## 2019-09-18 DIAGNOSIS — D509 Iron deficiency anemia, unspecified: Secondary | ICD-10-CM | POA: Diagnosis not present

## 2019-09-18 DIAGNOSIS — I10 Essential (primary) hypertension: Secondary | ICD-10-CM | POA: Diagnosis not present

## 2019-09-18 DIAGNOSIS — C541 Malignant neoplasm of endometrium: Secondary | ICD-10-CM | POA: Diagnosis not present

## 2019-09-18 DIAGNOSIS — E782 Mixed hyperlipidemia: Secondary | ICD-10-CM | POA: Diagnosis not present

## 2019-09-23 NOTE — Progress Notes (Deleted)
   Subjective:    Patient ID: Nicole Cochran, female    DOB: 01/02/1935, 84 y.o.   MRN: [id number]  HPI Nicole Cochran is an 84 year old female with a past medial history of hypertension, uterine sarcoma and Crohn's disease s/p subtotal colectomy with colostomy in 2002.   Flexible sigmoidoscopy in 2012.   S/P  total abdominal hysterectomy, bilateral salpingo-oophorectomy, rt pelvic and para-aortic lymphadenectomy, small bowel resection.  05/23/2018.  Endometrial polyp, and curettage by Dr. Mallory Shirk.  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   Diagnosis of malignant mixed mullerian tumor per pathology She received chemo at [facility] followed by Dr. Cecille Aver  CBC Latest Ref Rng & Units 06/03/2019 02/28/2018 02/22/2018  WBC 4.0 - 10.5 K/uL 6.8 8.2 12.1(H)  Hemoglobin 12.0 - 15.0 g/dL 10.2(L) 9.7(L) 10.1(L)  Hematocrit 36.0 - 46.0 % 29.3(L) 28.8(L) 29.8(L)  Platelets 150 - 400 K/uL 239 442(H) 350      Past Medical History:  Diagnosis Date  . Arthritis   . Cancer ([place])   . Crohn's   . Heart murmur   . Hypertension   . Metastatic cancer to bone ([place]) 05/02/2019  . Pneumonia    67 years old,  and 2002  . Right knee pain   . Uterine sarcoma [facility])    Past Surgical History:  Procedure Laterality Date  . ABDOMINAL HYSTERECTOMY    . APPENDECTOMY    . CATARACT EXTRACTION Bilateral 2009  . COLONOSCOPY  2011   @ [place]  . COLOSTOMY    . HYSTEROSCOPY W/D&C N/A 02/23/2018   Procedure: DILATATION AND CURETTAGE /HYSTEROSCOPY;  Surgeon: Jonnie Kind, MD;  Location: [facility];  Service: Gynecology;  Laterality: N/A;  . ILEOSTOMY  2002  . LAPAROTOMY N/A 06/04/2013   Procedure: EXPLORATORY LAPAROTOMY;  Surgeon: Jamesetta So, MD;  Location: [facility];  Service: General;  Laterality: N/A;   parastomal hernia repair  . LYSIS OF ADHESION N/A 06/04/2013   Procedure: LYSIS OF ADHESION;  Surgeon: Jamesetta So, MD;  Location: [facility];  Service: General;   Laterality: N/A;  . NASAL SINUS SURGERY    . POLYPECTOMY N/A 02/23/2018   Procedure: ENDOMETRIAL POLYPECTOMY;  Surgeon: Jonnie Kind, MD;  Location: [facility];  Service: Gynecology;  Laterality: N/A;  . SUBTOTAL COLECTOMY  2002  . TUBAL LIGATION         Review of Systems     Objective:   Physical Exam        Assessment & Plan:   26. 84 year old female with Crohn's disease   2. 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 Normocytic anemia

## 2019-09-26 ENCOUNTER — Ambulatory Visit (INDEPENDENT_AMBULATORY_CARE_PROVIDER_SITE_OTHER): Payer: PPO | Admitting: Nurse Practitioner

## 2019-09-26 DIAGNOSIS — I8290 Acute embolism and thrombosis of unspecified vein: Secondary | ICD-10-CM | POA: Diagnosis not present

## 2019-09-26 DIAGNOSIS — C7951 Secondary malignant neoplasm of bone: Secondary | ICD-10-CM | POA: Diagnosis not present

## 2019-09-26 DIAGNOSIS — R0602 Shortness of breath: Secondary | ICD-10-CM | POA: Diagnosis not present

## 2019-09-26 DIAGNOSIS — Z7901 Long term (current) use of anticoagulants: Secondary | ICD-10-CM | POA: Diagnosis not present

## 2019-09-26 DIAGNOSIS — E871 Hypo-osmolality and hyponatremia: Secondary | ICD-10-CM | POA: Diagnosis not present

## 2019-09-26 DIAGNOSIS — C55 Malignant neoplasm of uterus, part unspecified: Secondary | ICD-10-CM | POA: Diagnosis not present

## 2019-09-26 DIAGNOSIS — I898 Other specified noninfective disorders of lymphatic vessels and lymph nodes: Secondary | ICD-10-CM | POA: Diagnosis not present

## 2019-09-26 DIAGNOSIS — I82890 Acute embolism and thrombosis of other specified veins: Secondary | ICD-10-CM | POA: Diagnosis not present

## 2019-09-26 DIAGNOSIS — R3914 Feeling of incomplete bladder emptying: Secondary | ICD-10-CM | POA: Diagnosis not present

## 2019-09-26 DIAGNOSIS — G9340 Encephalopathy, unspecified: Secondary | ICD-10-CM | POA: Diagnosis not present

## 2019-09-26 DIAGNOSIS — Z79899 Other long term (current) drug therapy: Secondary | ICD-10-CM | POA: Diagnosis not present

## 2019-09-26 DIAGNOSIS — C787 Secondary malignant neoplasm of liver and intrahepatic bile duct: Secondary | ICD-10-CM | POA: Diagnosis not present

## 2019-09-26 DIAGNOSIS — G629 Polyneuropathy, unspecified: Secondary | ICD-10-CM | POA: Diagnosis not present

## 2019-10-02 DIAGNOSIS — D509 Iron deficiency anemia, unspecified: Secondary | ICD-10-CM | POA: Diagnosis not present

## 2019-10-02 DIAGNOSIS — I10 Essential (primary) hypertension: Secondary | ICD-10-CM | POA: Diagnosis not present

## 2019-10-02 DIAGNOSIS — E782 Mixed hyperlipidemia: Secondary | ICD-10-CM | POA: Diagnosis not present

## 2019-10-02 DIAGNOSIS — R7301 Impaired fasting glucose: Secondary | ICD-10-CM | POA: Diagnosis not present

## 2019-10-02 DIAGNOSIS — C541 Malignant neoplasm of endometrium: Secondary | ICD-10-CM | POA: Diagnosis not present

## 2019-10-17 DIAGNOSIS — R339 Retention of urine, unspecified: Secondary | ICD-10-CM | POA: Diagnosis not present

## 2019-10-17 DIAGNOSIS — R918 Other nonspecific abnormal finding of lung field: Secondary | ICD-10-CM | POA: Diagnosis not present

## 2019-10-17 DIAGNOSIS — E871 Hypo-osmolality and hyponatremia: Secondary | ICD-10-CM | POA: Diagnosis not present

## 2019-10-17 DIAGNOSIS — C55 Malignant neoplasm of uterus, part unspecified: Secondary | ICD-10-CM | POA: Diagnosis not present

## 2019-10-17 DIAGNOSIS — I898 Other specified noninfective disorders of lymphatic vessels and lymph nodes: Secondary | ICD-10-CM | POA: Diagnosis not present

## 2019-10-17 DIAGNOSIS — I8291 Chronic embolism and thrombosis of unspecified vein: Secondary | ICD-10-CM | POA: Diagnosis not present

## 2019-10-17 DIAGNOSIS — N1330 Unspecified hydronephrosis: Secondary | ICD-10-CM | POA: Diagnosis not present

## 2019-10-17 DIAGNOSIS — I82890 Acute embolism and thrombosis of other specified veins: Secondary | ICD-10-CM | POA: Diagnosis not present

## 2019-10-17 DIAGNOSIS — Z7901 Long term (current) use of anticoagulants: Secondary | ICD-10-CM | POA: Diagnosis not present

## 2019-10-17 DIAGNOSIS — C7951 Secondary malignant neoplasm of bone: Secondary | ICD-10-CM | POA: Diagnosis not present

## 2019-10-24 DIAGNOSIS — N132 Hydronephrosis with renal and ureteral calculous obstruction: Secondary | ICD-10-CM | POA: Diagnosis not present

## 2019-10-24 DIAGNOSIS — C787 Secondary malignant neoplasm of liver and intrahepatic bile duct: Secondary | ICD-10-CM | POA: Diagnosis not present

## 2019-10-24 DIAGNOSIS — C7951 Secondary malignant neoplasm of bone: Secondary | ICD-10-CM | POA: Diagnosis not present

## 2019-10-24 DIAGNOSIS — G629 Polyneuropathy, unspecified: Secondary | ICD-10-CM | POA: Diagnosis not present

## 2019-10-24 DIAGNOSIS — Z79899 Other long term (current) drug therapy: Secondary | ICD-10-CM | POA: Diagnosis not present

## 2019-10-24 DIAGNOSIS — Z5111 Encounter for antineoplastic chemotherapy: Secondary | ICD-10-CM | POA: Diagnosis not present

## 2019-10-24 DIAGNOSIS — E876 Hypokalemia: Secondary | ICD-10-CM | POA: Diagnosis not present

## 2019-10-24 DIAGNOSIS — C55 Malignant neoplasm of uterus, part unspecified: Secondary | ICD-10-CM | POA: Diagnosis not present

## 2019-10-24 DIAGNOSIS — R350 Frequency of micturition: Secondary | ICD-10-CM | POA: Diagnosis not present

## 2019-10-24 DIAGNOSIS — Z7901 Long term (current) use of anticoagulants: Secondary | ICD-10-CM | POA: Diagnosis not present

## 2019-10-24 DIAGNOSIS — R0602 Shortness of breath: Secondary | ICD-10-CM | POA: Diagnosis not present

## 2019-10-24 DIAGNOSIS — I82890 Acute embolism and thrombosis of other specified veins: Secondary | ICD-10-CM | POA: Diagnosis not present

## 2019-10-24 DIAGNOSIS — R748 Abnormal levels of other serum enzymes: Secondary | ICD-10-CM | POA: Diagnosis not present

## 2019-10-24 DIAGNOSIS — E871 Hypo-osmolality and hyponatremia: Secondary | ICD-10-CM | POA: Diagnosis not present

## 2019-10-31 DIAGNOSIS — C55 Malignant neoplasm of uterus, part unspecified: Secondary | ICD-10-CM | POA: Diagnosis not present

## 2019-10-31 DIAGNOSIS — N135 Crossing vessel and stricture of ureter without hydronephrosis: Secondary | ICD-10-CM | POA: Diagnosis not present

## 2019-10-31 DIAGNOSIS — N1330 Unspecified hydronephrosis: Secondary | ICD-10-CM | POA: Diagnosis not present

## 2019-11-06 DIAGNOSIS — E782 Mixed hyperlipidemia: Secondary | ICD-10-CM | POA: Diagnosis not present

## 2019-11-06 DIAGNOSIS — C541 Malignant neoplasm of endometrium: Secondary | ICD-10-CM | POA: Diagnosis not present

## 2019-11-06 DIAGNOSIS — N189 Chronic kidney disease, unspecified: Secondary | ICD-10-CM | POA: Diagnosis not present

## 2019-11-06 DIAGNOSIS — E039 Hypothyroidism, unspecified: Secondary | ICD-10-CM | POA: Diagnosis not present

## 2019-11-06 DIAGNOSIS — I10 Essential (primary) hypertension: Secondary | ICD-10-CM | POA: Diagnosis not present

## 2019-11-06 DIAGNOSIS — D509 Iron deficiency anemia, unspecified: Secondary | ICD-10-CM | POA: Diagnosis not present

## 2019-11-06 DIAGNOSIS — R7301 Impaired fasting glucose: Secondary | ICD-10-CM | POA: Diagnosis not present

## 2019-11-09 DIAGNOSIS — I129 Hypertensive chronic kidney disease with stage 1 through stage 4 chronic kidney disease, or unspecified chronic kidney disease: Secondary | ICD-10-CM | POA: Diagnosis not present

## 2019-11-09 DIAGNOSIS — I10 Essential (primary) hypertension: Secondary | ICD-10-CM | POA: Diagnosis not present

## 2019-11-09 DIAGNOSIS — K8051 Calculus of bile duct without cholangitis or cholecystitis with obstruction: Secondary | ICD-10-CM | POA: Diagnosis not present

## 2019-11-09 DIAGNOSIS — R7301 Impaired fasting glucose: Secondary | ICD-10-CM | POA: Diagnosis not present

## 2019-11-09 DIAGNOSIS — N189 Chronic kidney disease, unspecified: Secondary | ICD-10-CM | POA: Diagnosis not present

## 2019-11-09 DIAGNOSIS — D509 Iron deficiency anemia, unspecified: Secondary | ICD-10-CM | POA: Diagnosis not present

## 2019-11-09 DIAGNOSIS — Z932 Ileostomy status: Secondary | ICD-10-CM | POA: Diagnosis not present

## 2019-11-09 DIAGNOSIS — C541 Malignant neoplasm of endometrium: Secondary | ICD-10-CM | POA: Diagnosis not present

## 2019-11-09 DIAGNOSIS — E039 Hypothyroidism, unspecified: Secondary | ICD-10-CM | POA: Diagnosis not present

## 2019-11-09 DIAGNOSIS — E782 Mixed hyperlipidemia: Secondary | ICD-10-CM | POA: Diagnosis not present

## 2019-11-09 DIAGNOSIS — E7849 Other hyperlipidemia: Secondary | ICD-10-CM | POA: Diagnosis not present

## 2019-11-09 DIAGNOSIS — Z933 Colostomy status: Secondary | ICD-10-CM | POA: Diagnosis not present

## 2019-11-14 DIAGNOSIS — N132 Hydronephrosis with renal and ureteral calculous obstruction: Secondary | ICD-10-CM | POA: Diagnosis not present

## 2019-11-14 DIAGNOSIS — D649 Anemia, unspecified: Secondary | ICD-10-CM | POA: Diagnosis not present

## 2019-11-14 DIAGNOSIS — Z79899 Other long term (current) drug therapy: Secondary | ICD-10-CM | POA: Diagnosis not present

## 2019-11-14 DIAGNOSIS — R350 Frequency of micturition: Secondary | ICD-10-CM | POA: Diagnosis not present

## 2019-11-14 DIAGNOSIS — N1330 Unspecified hydronephrosis: Secondary | ICD-10-CM | POA: Diagnosis not present

## 2019-11-14 DIAGNOSIS — C7951 Secondary malignant neoplasm of bone: Secondary | ICD-10-CM | POA: Diagnosis not present

## 2019-11-14 DIAGNOSIS — I8291 Chronic embolism and thrombosis of unspecified vein: Secondary | ICD-10-CM | POA: Diagnosis not present

## 2019-11-14 DIAGNOSIS — C787 Secondary malignant neoplasm of liver and intrahepatic bile duct: Secondary | ICD-10-CM | POA: Diagnosis not present

## 2019-11-14 DIAGNOSIS — I82890 Acute embolism and thrombosis of other specified veins: Secondary | ICD-10-CM | POA: Diagnosis not present

## 2019-11-14 DIAGNOSIS — R0602 Shortness of breath: Secondary | ICD-10-CM | POA: Diagnosis not present

## 2019-11-14 DIAGNOSIS — R748 Abnormal levels of other serum enzymes: Secondary | ICD-10-CM | POA: Diagnosis not present

## 2019-11-14 DIAGNOSIS — C55 Malignant neoplasm of uterus, part unspecified: Secondary | ICD-10-CM | POA: Diagnosis not present

## 2019-11-14 DIAGNOSIS — E871 Hypo-osmolality and hyponatremia: Secondary | ICD-10-CM | POA: Diagnosis not present

## 2019-11-14 DIAGNOSIS — Z7901 Long term (current) use of anticoagulants: Secondary | ICD-10-CM | POA: Diagnosis not present

## 2019-11-14 DIAGNOSIS — E876 Hypokalemia: Secondary | ICD-10-CM | POA: Diagnosis not present

## 2019-11-14 DIAGNOSIS — G629 Polyneuropathy, unspecified: Secondary | ICD-10-CM | POA: Diagnosis not present

## 2019-11-22 DIAGNOSIS — Z9221 Personal history of antineoplastic chemotherapy: Secondary | ICD-10-CM | POA: Diagnosis not present

## 2019-11-22 DIAGNOSIS — Z8542 Personal history of malignant neoplasm of other parts of uterus: Secondary | ICD-10-CM | POA: Diagnosis not present

## 2019-11-22 DIAGNOSIS — E871 Hypo-osmolality and hyponatremia: Secondary | ICD-10-CM | POA: Diagnosis not present

## 2019-11-22 DIAGNOSIS — Z90710 Acquired absence of both cervix and uterus: Secondary | ICD-10-CM | POA: Diagnosis not present

## 2019-11-22 DIAGNOSIS — Z8719 Personal history of other diseases of the digestive system: Secondary | ICD-10-CM | POA: Diagnosis not present

## 2019-11-22 DIAGNOSIS — R935 Abnormal findings on diagnostic imaging of other abdominal regions, including retroperitoneum: Secondary | ICD-10-CM | POA: Diagnosis not present

## 2019-11-22 DIAGNOSIS — R339 Retention of urine, unspecified: Secondary | ICD-10-CM | POA: Diagnosis not present

## 2019-11-22 DIAGNOSIS — Z933 Colostomy status: Secondary | ICD-10-CM | POA: Diagnosis not present

## 2019-11-22 DIAGNOSIS — N1330 Unspecified hydronephrosis: Secondary | ICD-10-CM | POA: Diagnosis not present

## 2019-11-23 DIAGNOSIS — Z95828 Presence of other vascular implants and grafts: Secondary | ICD-10-CM | POA: Diagnosis not present

## 2019-11-28 DIAGNOSIS — T451X5A Adverse effect of antineoplastic and immunosuppressive drugs, initial encounter: Secondary | ICD-10-CM | POA: Diagnosis not present

## 2019-11-28 DIAGNOSIS — Z79899 Other long term (current) drug therapy: Secondary | ICD-10-CM | POA: Diagnosis not present

## 2019-11-28 DIAGNOSIS — R339 Retention of urine, unspecified: Secondary | ICD-10-CM | POA: Diagnosis not present

## 2019-11-28 DIAGNOSIS — R32 Unspecified urinary incontinence: Secondary | ICD-10-CM | POA: Diagnosis not present

## 2019-11-28 DIAGNOSIS — K921 Melena: Secondary | ICD-10-CM | POA: Diagnosis not present

## 2019-11-28 DIAGNOSIS — N1330 Unspecified hydronephrosis: Secondary | ICD-10-CM | POA: Diagnosis not present

## 2019-11-28 DIAGNOSIS — Z7901 Long term (current) use of anticoagulants: Secondary | ICD-10-CM | POA: Diagnosis not present

## 2019-11-28 DIAGNOSIS — G620 Drug-induced polyneuropathy: Secondary | ICD-10-CM | POA: Diagnosis not present

## 2019-11-28 DIAGNOSIS — I8291 Chronic embolism and thrombosis of unspecified vein: Secondary | ICD-10-CM | POA: Diagnosis not present

## 2019-11-28 DIAGNOSIS — Z923 Personal history of irradiation: Secondary | ICD-10-CM | POA: Diagnosis not present

## 2019-11-28 DIAGNOSIS — C55 Malignant neoplasm of uterus, part unspecified: Secondary | ICD-10-CM | POA: Diagnosis not present

## 2019-11-28 DIAGNOSIS — C787 Secondary malignant neoplasm of liver and intrahepatic bile duct: Secondary | ICD-10-CM | POA: Diagnosis not present

## 2019-11-28 DIAGNOSIS — C7951 Secondary malignant neoplasm of bone: Secondary | ICD-10-CM | POA: Diagnosis not present

## 2019-12-05 DIAGNOSIS — Z79899 Other long term (current) drug therapy: Secondary | ICD-10-CM | POA: Diagnosis not present

## 2019-12-05 DIAGNOSIS — C55 Malignant neoplasm of uterus, part unspecified: Secondary | ICD-10-CM | POA: Diagnosis not present

## 2019-12-05 DIAGNOSIS — I8291 Chronic embolism and thrombosis of unspecified vein: Secondary | ICD-10-CM | POA: Diagnosis not present

## 2019-12-05 DIAGNOSIS — E871 Hypo-osmolality and hyponatremia: Secondary | ICD-10-CM | POA: Diagnosis not present

## 2019-12-05 DIAGNOSIS — N1330 Unspecified hydronephrosis: Secondary | ICD-10-CM | POA: Diagnosis not present

## 2019-12-05 DIAGNOSIS — R630 Anorexia: Secondary | ICD-10-CM | POA: Diagnosis not present

## 2019-12-05 DIAGNOSIS — I10 Essential (primary) hypertension: Secondary | ICD-10-CM | POA: Diagnosis not present

## 2019-12-05 DIAGNOSIS — C7951 Secondary malignant neoplasm of bone: Secondary | ICD-10-CM | POA: Diagnosis not present

## 2019-12-05 DIAGNOSIS — G620 Drug-induced polyneuropathy: Secondary | ICD-10-CM | POA: Diagnosis not present

## 2019-12-05 DIAGNOSIS — D696 Thrombocytopenia, unspecified: Secondary | ICD-10-CM | POA: Diagnosis not present

## 2019-12-05 DIAGNOSIS — N1339 Other hydronephrosis: Secondary | ICD-10-CM | POA: Diagnosis not present

## 2019-12-05 DIAGNOSIS — T451X5A Adverse effect of antineoplastic and immunosuppressive drugs, initial encounter: Secondary | ICD-10-CM | POA: Diagnosis not present

## 2019-12-05 DIAGNOSIS — T451X5S Adverse effect of antineoplastic and immunosuppressive drugs, sequela: Secondary | ICD-10-CM | POA: Diagnosis not present

## 2019-12-05 DIAGNOSIS — D6481 Anemia due to antineoplastic chemotherapy: Secondary | ICD-10-CM | POA: Diagnosis not present

## 2019-12-05 DIAGNOSIS — R809 Proteinuria, unspecified: Secondary | ICD-10-CM | POA: Diagnosis not present

## 2019-12-05 DIAGNOSIS — R5383 Other fatigue: Secondary | ICD-10-CM | POA: Diagnosis not present

## 2019-12-05 DIAGNOSIS — K922 Gastrointestinal hemorrhage, unspecified: Secondary | ICD-10-CM | POA: Diagnosis not present

## 2019-12-05 DIAGNOSIS — R748 Abnormal levels of other serum enzymes: Secondary | ICD-10-CM | POA: Diagnosis not present

## 2019-12-05 DIAGNOSIS — D649 Anemia, unspecified: Secondary | ICD-10-CM | POA: Diagnosis not present

## 2019-12-05 DIAGNOSIS — R7401 Elevation of levels of liver transaminase levels: Secondary | ICD-10-CM | POA: Diagnosis not present

## 2019-12-05 DIAGNOSIS — C787 Secondary malignant neoplasm of liver and intrahepatic bile duct: Secondary | ICD-10-CM | POA: Diagnosis not present

## 2019-12-12 DIAGNOSIS — N135 Crossing vessel and stricture of ureter without hydronephrosis: Secondary | ICD-10-CM | POA: Diagnosis not present

## 2019-12-13 DIAGNOSIS — R918 Other nonspecific abnormal finding of lung field: Secondary | ICD-10-CM | POA: Diagnosis not present

## 2019-12-13 DIAGNOSIS — C787 Secondary malignant neoplasm of liver and intrahepatic bile duct: Secondary | ICD-10-CM | POA: Diagnosis not present

## 2019-12-13 DIAGNOSIS — C7951 Secondary malignant neoplasm of bone: Secondary | ICD-10-CM | POA: Diagnosis not present

## 2019-12-13 DIAGNOSIS — C778 Secondary and unspecified malignant neoplasm of lymph nodes of multiple regions: Secondary | ICD-10-CM | POA: Diagnosis not present

## 2019-12-19 DIAGNOSIS — C7951 Secondary malignant neoplasm of bone: Secondary | ICD-10-CM | POA: Diagnosis not present

## 2019-12-19 DIAGNOSIS — I8291 Chronic embolism and thrombosis of unspecified vein: Secondary | ICD-10-CM | POA: Diagnosis not present

## 2019-12-19 DIAGNOSIS — K921 Melena: Secondary | ICD-10-CM | POA: Diagnosis not present

## 2019-12-19 DIAGNOSIS — C55 Malignant neoplasm of uterus, part unspecified: Secondary | ICD-10-CM | POA: Diagnosis not present

## 2019-12-19 DIAGNOSIS — G620 Drug-induced polyneuropathy: Secondary | ICD-10-CM | POA: Diagnosis not present

## 2019-12-19 DIAGNOSIS — N1330 Unspecified hydronephrosis: Secondary | ICD-10-CM | POA: Diagnosis not present

## 2019-12-19 DIAGNOSIS — Z7901 Long term (current) use of anticoagulants: Secondary | ICD-10-CM | POA: Diagnosis not present

## 2019-12-19 DIAGNOSIS — R7401 Elevation of levels of liver transaminase levels: Secondary | ICD-10-CM | POA: Diagnosis not present

## 2019-12-19 DIAGNOSIS — C787 Secondary malignant neoplasm of liver and intrahepatic bile duct: Secondary | ICD-10-CM | POA: Diagnosis not present

## 2019-12-19 DIAGNOSIS — T451X5A Adverse effect of antineoplastic and immunosuppressive drugs, initial encounter: Secondary | ICD-10-CM | POA: Diagnosis not present

## 2019-12-19 DIAGNOSIS — E871 Hypo-osmolality and hyponatremia: Secondary | ICD-10-CM | POA: Diagnosis not present

## 2019-12-19 DIAGNOSIS — I82890 Acute embolism and thrombosis of other specified veins: Secondary | ICD-10-CM | POA: Diagnosis not present

## 2019-12-19 DIAGNOSIS — N135 Crossing vessel and stricture of ureter without hydronephrosis: Secondary | ICD-10-CM | POA: Diagnosis not present

## 2019-12-19 DIAGNOSIS — G629 Polyneuropathy, unspecified: Secondary | ICD-10-CM | POA: Diagnosis not present

## 2019-12-19 DIAGNOSIS — R748 Abnormal levels of other serum enzymes: Secondary | ICD-10-CM | POA: Diagnosis not present

## 2019-12-19 DIAGNOSIS — Z79899 Other long term (current) drug therapy: Secondary | ICD-10-CM | POA: Diagnosis not present

## 2019-12-20 DIAGNOSIS — W1839XA Other fall on same level, initial encounter: Secondary | ICD-10-CM | POA: Diagnosis not present

## 2019-12-20 DIAGNOSIS — M25512 Pain in left shoulder: Secondary | ICD-10-CM | POA: Diagnosis not present

## 2019-12-20 DIAGNOSIS — I440 Atrioventricular block, first degree: Secondary | ICD-10-CM | POA: Diagnosis not present

## 2019-12-20 DIAGNOSIS — S0083XA Contusion of other part of head, initial encounter: Secondary | ICD-10-CM | POA: Diagnosis not present

## 2019-12-20 DIAGNOSIS — Y998 Other external cause status: Secondary | ICD-10-CM | POA: Diagnosis not present

## 2019-12-20 DIAGNOSIS — S42302A Unspecified fracture of shaft of humerus, left arm, initial encounter for closed fracture: Secondary | ICD-10-CM | POA: Diagnosis not present

## 2019-12-20 DIAGNOSIS — Z20822 Contact with and (suspected) exposure to covid-19: Secondary | ICD-10-CM | POA: Diagnosis not present

## 2019-12-20 DIAGNOSIS — M542 Cervicalgia: Secondary | ICD-10-CM | POA: Diagnosis not present

## 2019-12-20 DIAGNOSIS — W19XXXA Unspecified fall, initial encounter: Secondary | ICD-10-CM | POA: Diagnosis not present

## 2019-12-20 DIAGNOSIS — S42322A Displaced transverse fracture of shaft of humerus, left arm, initial encounter for closed fracture: Secondary | ICD-10-CM | POA: Diagnosis not present

## 2019-12-20 DIAGNOSIS — M25522 Pain in left elbow: Secondary | ICD-10-CM | POA: Diagnosis not present

## 2019-12-21 DIAGNOSIS — W19XXXA Unspecified fall, initial encounter: Secondary | ICD-10-CM | POA: Diagnosis not present

## 2019-12-21 DIAGNOSIS — E7800 Pure hypercholesterolemia, unspecified: Secondary | ICD-10-CM | POA: Diagnosis not present

## 2019-12-21 DIAGNOSIS — Z933 Colostomy status: Secondary | ICD-10-CM | POA: Diagnosis not present

## 2019-12-21 DIAGNOSIS — C787 Secondary malignant neoplasm of liver and intrahepatic bile duct: Secondary | ICD-10-CM | POA: Diagnosis not present

## 2019-12-21 DIAGNOSIS — Z8542 Personal history of malignant neoplasm of other parts of uterus: Secondary | ICD-10-CM | POA: Diagnosis not present

## 2019-12-21 DIAGNOSIS — S42202A Unspecified fracture of upper end of left humerus, initial encounter for closed fracture: Secondary | ICD-10-CM | POA: Diagnosis not present

## 2019-12-21 DIAGNOSIS — C55 Malignant neoplasm of uterus, part unspecified: Secondary | ICD-10-CM | POA: Diagnosis not present

## 2019-12-21 DIAGNOSIS — S42352A Displaced comminuted fracture of shaft of humerus, left arm, initial encounter for closed fracture: Secondary | ICD-10-CM | POA: Diagnosis not present

## 2019-12-21 DIAGNOSIS — I10 Essential (primary) hypertension: Secondary | ICD-10-CM | POA: Diagnosis not present

## 2019-12-21 DIAGNOSIS — D649 Anemia, unspecified: Secondary | ICD-10-CM | POA: Diagnosis not present

## 2019-12-21 DIAGNOSIS — K8051 Calculus of bile duct without cholangitis or cholecystitis with obstruction: Secondary | ICD-10-CM | POA: Diagnosis not present

## 2019-12-21 DIAGNOSIS — Z932 Ileostomy status: Secondary | ICD-10-CM | POA: Diagnosis not present

## 2019-12-21 DIAGNOSIS — T451X5A Adverse effect of antineoplastic and immunosuppressive drugs, initial encounter: Secondary | ICD-10-CM | POA: Diagnosis not present

## 2019-12-21 DIAGNOSIS — R7989 Other specified abnormal findings of blood chemistry: Secondary | ICD-10-CM | POA: Diagnosis not present

## 2019-12-21 DIAGNOSIS — E782 Mixed hyperlipidemia: Secondary | ICD-10-CM | POA: Diagnosis not present

## 2019-12-21 DIAGNOSIS — C7951 Secondary malignant neoplasm of bone: Secondary | ICD-10-CM | POA: Diagnosis not present

## 2019-12-21 DIAGNOSIS — D509 Iron deficiency anemia, unspecified: Secondary | ICD-10-CM | POA: Diagnosis not present

## 2019-12-21 DIAGNOSIS — E7849 Other hyperlipidemia: Secondary | ICD-10-CM | POA: Diagnosis not present

## 2019-12-21 DIAGNOSIS — Z0000 Encounter for general adult medical examination without abnormal findings: Secondary | ICD-10-CM | POA: Diagnosis not present

## 2019-12-21 DIAGNOSIS — N1330 Unspecified hydronephrosis: Secondary | ICD-10-CM | POA: Diagnosis not present

## 2019-12-21 DIAGNOSIS — R7301 Impaired fasting glucose: Secondary | ICD-10-CM | POA: Diagnosis not present

## 2019-12-21 DIAGNOSIS — C541 Malignant neoplasm of endometrium: Secondary | ICD-10-CM | POA: Diagnosis not present

## 2019-12-21 DIAGNOSIS — Z6829 Body mass index (BMI) 29.0-29.9, adult: Secondary | ICD-10-CM | POA: Diagnosis not present

## 2019-12-21 DIAGNOSIS — G620 Drug-induced polyneuropathy: Secondary | ICD-10-CM | POA: Diagnosis not present

## 2019-12-21 DIAGNOSIS — E871 Hypo-osmolality and hyponatremia: Secondary | ICD-10-CM | POA: Diagnosis not present

## 2019-12-21 DIAGNOSIS — S42352D Displaced comminuted fracture of shaft of humerus, left arm, subsequent encounter for fracture with routine healing: Secondary | ICD-10-CM | POA: Diagnosis not present

## 2019-12-21 DIAGNOSIS — Z7189 Other specified counseling: Secondary | ICD-10-CM | POA: Diagnosis not present

## 2019-12-21 DIAGNOSIS — E222 Syndrome of inappropriate secretion of antidiuretic hormone: Secondary | ICD-10-CM | POA: Diagnosis not present

## 2019-12-22 DIAGNOSIS — W19XXXA Unspecified fall, initial encounter: Secondary | ICD-10-CM | POA: Diagnosis not present

## 2019-12-22 DIAGNOSIS — I10 Essential (primary) hypertension: Secondary | ICD-10-CM | POA: Diagnosis not present

## 2019-12-22 DIAGNOSIS — S42352A Displaced comminuted fracture of shaft of humerus, left arm, initial encounter for closed fracture: Secondary | ICD-10-CM | POA: Diagnosis not present

## 2019-12-28 DIAGNOSIS — Z0000 Encounter for general adult medical examination without abnormal findings: Secondary | ICD-10-CM | POA: Diagnosis not present

## 2019-12-28 DIAGNOSIS — Z6829 Body mass index (BMI) 29.0-29.9, adult: Secondary | ICD-10-CM | POA: Diagnosis not present

## 2019-12-28 DIAGNOSIS — I10 Essential (primary) hypertension: Secondary | ICD-10-CM | POA: Diagnosis not present

## 2019-12-28 DIAGNOSIS — C541 Malignant neoplasm of endometrium: Secondary | ICD-10-CM | POA: Diagnosis not present

## 2019-12-28 DIAGNOSIS — E782 Mixed hyperlipidemia: Secondary | ICD-10-CM | POA: Diagnosis not present

## 2019-12-28 DIAGNOSIS — D509 Iron deficiency anemia, unspecified: Secondary | ICD-10-CM | POA: Diagnosis not present

## 2019-12-28 DIAGNOSIS — R7301 Impaired fasting glucose: Secondary | ICD-10-CM | POA: Diagnosis not present

## 2019-12-28 DIAGNOSIS — E7849 Other hyperlipidemia: Secondary | ICD-10-CM | POA: Diagnosis not present

## 2020-01-03 DIAGNOSIS — N1330 Unspecified hydronephrosis: Secondary | ICD-10-CM | POA: Diagnosis not present

## 2020-01-03 DIAGNOSIS — I10 Essential (primary) hypertension: Secondary | ICD-10-CM | POA: Diagnosis not present

## 2020-01-03 DIAGNOSIS — S32509D Unspecified fracture of unspecified pubis, subsequent encounter for fracture with routine healing: Secondary | ICD-10-CM | POA: Diagnosis not present

## 2020-01-03 DIAGNOSIS — E871 Hypo-osmolality and hyponatremia: Secondary | ICD-10-CM | POA: Diagnosis not present

## 2020-01-08 DIAGNOSIS — R7301 Impaired fasting glucose: Secondary | ICD-10-CM | POA: Diagnosis not present

## 2020-01-08 DIAGNOSIS — E871 Hypo-osmolality and hyponatremia: Secondary | ICD-10-CM | POA: Diagnosis not present

## 2020-01-08 DIAGNOSIS — E782 Mixed hyperlipidemia: Secondary | ICD-10-CM | POA: Diagnosis not present

## 2020-01-08 DIAGNOSIS — D509 Iron deficiency anemia, unspecified: Secondary | ICD-10-CM | POA: Diagnosis not present

## 2020-01-08 DIAGNOSIS — C541 Malignant neoplasm of endometrium: Secondary | ICD-10-CM | POA: Diagnosis not present

## 2020-01-08 DIAGNOSIS — I10 Essential (primary) hypertension: Secondary | ICD-10-CM | POA: Diagnosis not present

## 2020-01-09 DIAGNOSIS — I10 Essential (primary) hypertension: Secondary | ICD-10-CM | POA: Diagnosis not present

## 2020-01-09 DIAGNOSIS — Z7401 Bed confinement status: Secondary | ICD-10-CM | POA: Diagnosis not present

## 2020-01-09 DIAGNOSIS — C55 Malignant neoplasm of uterus, part unspecified: Secondary | ICD-10-CM | POA: Diagnosis not present

## 2020-01-09 DIAGNOSIS — S42302S Unspecified fracture of shaft of humerus, left arm, sequela: Secondary | ICD-10-CM | POA: Diagnosis not present

## 2020-01-09 DIAGNOSIS — Z7901 Long term (current) use of anticoagulants: Secondary | ICD-10-CM | POA: Diagnosis not present

## 2020-01-09 DIAGNOSIS — G629 Polyneuropathy, unspecified: Secondary | ICD-10-CM | POA: Diagnosis not present

## 2020-01-09 DIAGNOSIS — N1330 Unspecified hydronephrosis: Secondary | ICD-10-CM | POA: Diagnosis not present

## 2020-01-09 DIAGNOSIS — R748 Abnormal levels of other serum enzymes: Secondary | ICD-10-CM | POA: Diagnosis not present

## 2020-01-09 DIAGNOSIS — E871 Hypo-osmolality and hyponatremia: Secondary | ICD-10-CM | POA: Diagnosis not present

## 2020-01-09 DIAGNOSIS — F329 Major depressive disorder, single episode, unspecified: Secondary | ICD-10-CM | POA: Diagnosis not present

## 2020-01-09 DIAGNOSIS — D630 Anemia in neoplastic disease: Secondary | ICD-10-CM | POA: Diagnosis not present

## 2020-01-09 DIAGNOSIS — E785 Hyperlipidemia, unspecified: Secondary | ICD-10-CM | POA: Diagnosis not present

## 2020-01-09 DIAGNOSIS — C7951 Secondary malignant neoplasm of bone: Secondary | ICD-10-CM | POA: Diagnosis not present

## 2020-01-09 DIAGNOSIS — C787 Secondary malignant neoplasm of liver and intrahepatic bile duct: Secondary | ICD-10-CM | POA: Diagnosis not present

## 2020-01-09 DIAGNOSIS — R627 Adult failure to thrive: Secondary | ICD-10-CM | POA: Diagnosis not present

## 2020-01-09 DIAGNOSIS — Z79899 Other long term (current) drug therapy: Secondary | ICD-10-CM | POA: Diagnosis not present

## 2020-01-10 DIAGNOSIS — I10 Essential (primary) hypertension: Secondary | ICD-10-CM | POA: Diagnosis not present

## 2020-01-10 DIAGNOSIS — R627 Adult failure to thrive: Secondary | ICD-10-CM | POA: Diagnosis not present

## 2020-01-10 DIAGNOSIS — C55 Malignant neoplasm of uterus, part unspecified: Secondary | ICD-10-CM | POA: Diagnosis not present

## 2020-01-10 DIAGNOSIS — E785 Hyperlipidemia, unspecified: Secondary | ICD-10-CM | POA: Diagnosis not present

## 2020-01-10 DIAGNOSIS — D630 Anemia in neoplastic disease: Secondary | ICD-10-CM | POA: Diagnosis not present

## 2020-01-12 DIAGNOSIS — E785 Hyperlipidemia, unspecified: Secondary | ICD-10-CM | POA: Diagnosis not present

## 2020-01-12 DIAGNOSIS — C55 Malignant neoplasm of uterus, part unspecified: Secondary | ICD-10-CM | POA: Diagnosis not present

## 2020-01-12 DIAGNOSIS — D630 Anemia in neoplastic disease: Secondary | ICD-10-CM | POA: Diagnosis not present

## 2020-01-12 DIAGNOSIS — M2550 Pain in unspecified joint: Secondary | ICD-10-CM | POA: Diagnosis not present

## 2020-01-12 DIAGNOSIS — R531 Weakness: Secondary | ICD-10-CM | POA: Diagnosis not present

## 2020-01-12 DIAGNOSIS — Z7401 Bed confinement status: Secondary | ICD-10-CM | POA: Diagnosis not present

## 2020-01-12 DIAGNOSIS — I10 Essential (primary) hypertension: Secondary | ICD-10-CM | POA: Diagnosis not present

## 2020-01-12 DIAGNOSIS — R627 Adult failure to thrive: Secondary | ICD-10-CM | POA: Diagnosis not present

## 2020-01-14 DIAGNOSIS — C541 Malignant neoplasm of endometrium: Secondary | ICD-10-CM | POA: Diagnosis not present

## 2020-01-14 DIAGNOSIS — E785 Hyperlipidemia, unspecified: Secondary | ICD-10-CM | POA: Diagnosis not present

## 2020-01-14 DIAGNOSIS — Z4789 Encounter for other orthopedic aftercare: Secondary | ICD-10-CM | POA: Diagnosis not present

## 2020-01-14 DIAGNOSIS — R627 Adult failure to thrive: Secondary | ICD-10-CM | POA: Diagnosis not present

## 2020-01-14 DIAGNOSIS — S42352D Displaced comminuted fracture of shaft of humerus, left arm, subsequent encounter for fracture with routine healing: Secondary | ICD-10-CM | POA: Diagnosis not present

## 2020-01-14 DIAGNOSIS — I10 Essential (primary) hypertension: Secondary | ICD-10-CM | POA: Diagnosis not present

## 2020-01-14 DIAGNOSIS — D649 Anemia, unspecified: Secondary | ICD-10-CM | POA: Diagnosis not present

## 2020-01-15 DIAGNOSIS — N135 Crossing vessel and stricture of ureter without hydronephrosis: Secondary | ICD-10-CM | POA: Diagnosis not present

## 2020-01-16 DIAGNOSIS — C55 Malignant neoplasm of uterus, part unspecified: Secondary | ICD-10-CM | POA: Diagnosis not present

## 2020-01-16 DIAGNOSIS — E871 Hypo-osmolality and hyponatremia: Secondary | ICD-10-CM | POA: Diagnosis not present

## 2020-01-16 DIAGNOSIS — R531 Weakness: Secondary | ICD-10-CM | POA: Diagnosis not present

## 2020-01-16 DIAGNOSIS — R5383 Other fatigue: Secondary | ICD-10-CM | POA: Diagnosis not present

## 2020-01-16 DIAGNOSIS — C7951 Secondary malignant neoplasm of bone: Secondary | ICD-10-CM | POA: Diagnosis not present

## 2020-01-16 DIAGNOSIS — R748 Abnormal levels of other serum enzymes: Secondary | ICD-10-CM | POA: Diagnosis not present

## 2020-01-16 DIAGNOSIS — G620 Drug-induced polyneuropathy: Secondary | ICD-10-CM | POA: Diagnosis not present

## 2020-01-16 DIAGNOSIS — N1330 Unspecified hydronephrosis: Secondary | ICD-10-CM | POA: Diagnosis not present

## 2020-01-16 DIAGNOSIS — T451X5A Adverse effect of antineoplastic and immunosuppressive drugs, initial encounter: Secondary | ICD-10-CM | POA: Diagnosis not present

## 2020-01-16 DIAGNOSIS — C787 Secondary malignant neoplasm of liver and intrahepatic bile duct: Secondary | ICD-10-CM | POA: Diagnosis not present

## 2020-01-16 DIAGNOSIS — N135 Crossing vessel and stricture of ureter without hydronephrosis: Secondary | ICD-10-CM | POA: Diagnosis not present

## 2020-01-16 DIAGNOSIS — Z79899 Other long term (current) drug therapy: Secondary | ICD-10-CM | POA: Diagnosis not present

## 2020-01-16 DIAGNOSIS — E876 Hypokalemia: Secondary | ICD-10-CM | POA: Diagnosis not present

## 2020-01-16 DIAGNOSIS — I8291 Chronic embolism and thrombosis of unspecified vein: Secondary | ICD-10-CM | POA: Diagnosis not present

## 2020-01-16 DIAGNOSIS — Z923 Personal history of irradiation: Secondary | ICD-10-CM | POA: Diagnosis not present

## 2020-01-16 DIAGNOSIS — Z7901 Long term (current) use of anticoagulants: Secondary | ICD-10-CM | POA: Diagnosis not present

## 2020-01-16 DIAGNOSIS — I82890 Acute embolism and thrombosis of other specified veins: Secondary | ICD-10-CM | POA: Diagnosis not present

## 2020-01-22 DIAGNOSIS — M6281 Muscle weakness (generalized): Secondary | ICD-10-CM | POA: Diagnosis not present

## 2020-01-22 DIAGNOSIS — C541 Malignant neoplasm of endometrium: Secondary | ICD-10-CM | POA: Diagnosis not present

## 2020-01-22 DIAGNOSIS — C419 Malignant neoplasm of bone and articular cartilage, unspecified: Secondary | ICD-10-CM | POA: Diagnosis not present

## 2020-01-22 DIAGNOSIS — E785 Hyperlipidemia, unspecified: Secondary | ICD-10-CM | POA: Diagnosis not present

## 2020-01-24 DIAGNOSIS — R531 Weakness: Secondary | ICD-10-CM | POA: Diagnosis not present

## 2020-01-30 DIAGNOSIS — I129 Hypertensive chronic kidney disease with stage 1 through stage 4 chronic kidney disease, or unspecified chronic kidney disease: Secondary | ICD-10-CM | POA: Diagnosis not present

## 2020-01-30 DIAGNOSIS — S42352D Displaced comminuted fracture of shaft of humerus, left arm, subsequent encounter for fracture with routine healing: Secondary | ICD-10-CM | POA: Diagnosis not present

## 2020-01-30 DIAGNOSIS — E039 Hypothyroidism, unspecified: Secondary | ICD-10-CM | POA: Diagnosis not present

## 2020-01-30 DIAGNOSIS — R627 Adult failure to thrive: Secondary | ICD-10-CM | POA: Diagnosis not present

## 2020-01-30 DIAGNOSIS — N1831 Chronic kidney disease, stage 3a: Secondary | ICD-10-CM | POA: Diagnosis not present

## 2020-01-30 DIAGNOSIS — C541 Malignant neoplasm of endometrium: Secondary | ICD-10-CM | POA: Diagnosis not present

## 2020-02-28 DEATH — deceased

## 2020-12-24 IMAGING — CT CT ANGIOGRAPHY CHEST
4 of 10 series · 17 of 46 positions shown · IV contrast (Isovue)
Comparison: CT scan of June 02, 2013.

CLINICAL DATA: Acute left flank pain.

EXAM:
CT ANGIOGRAPHY CHEST
CT ABDOMEN AND PELVIS WITH CONTRAST
TECHNIQUE: Multidetector CT imaging of the chest was performed using the
standard protocol during bolus administration of intravenous
contrast. Multiplanar CT image reconstructions and MIPs were
obtained to evaluate the vascular anatomy. Multidetector CT imaging
of the abdomen and pelvis was performed using the standard protocol
during bolus administration of intravenous contrast.
CONTRAST:  100mL OMNIPAQUE IOHEXOL 350 MG/ML SOLN

[Series 4: axial st · axial · 0.64mm/px · z∈[-174,+0]mm · 4 of 98 slices shown (1 of 2)]
[im 20/98  lung]
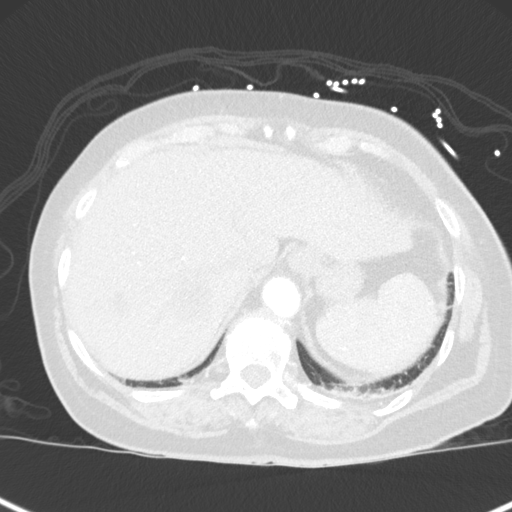
[im 39/98  lung]
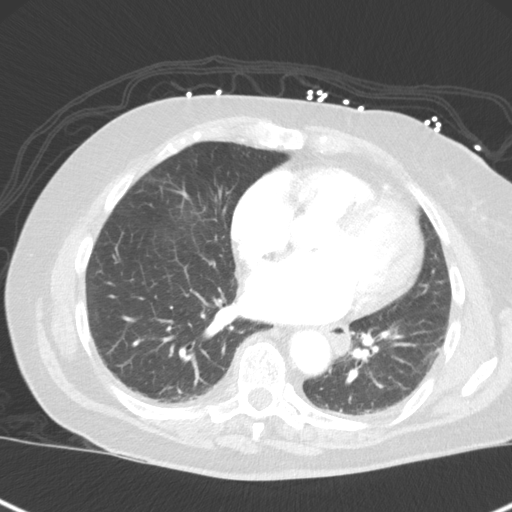
[im 59/98  lung]
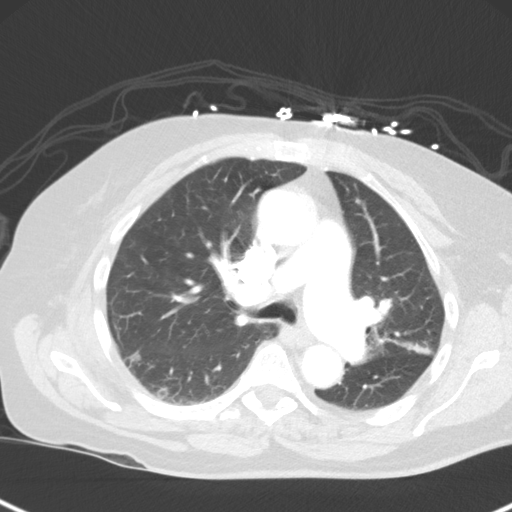
[im 78/98  lung]
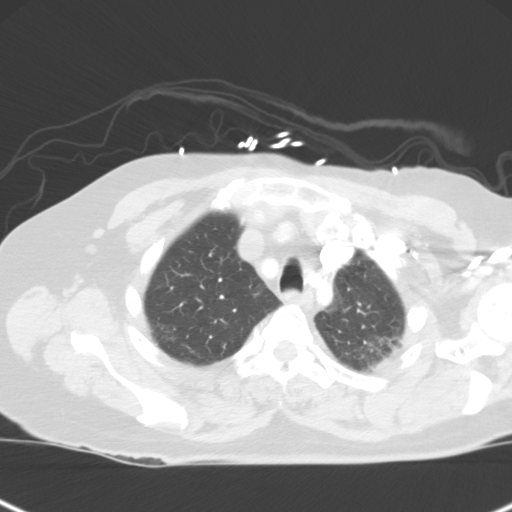

[Series 5: 3 thins · axial · 0.64mm/px · z∈[-198,+42]mm · 8 of 293 slices shown]
[im 35/293  lung]
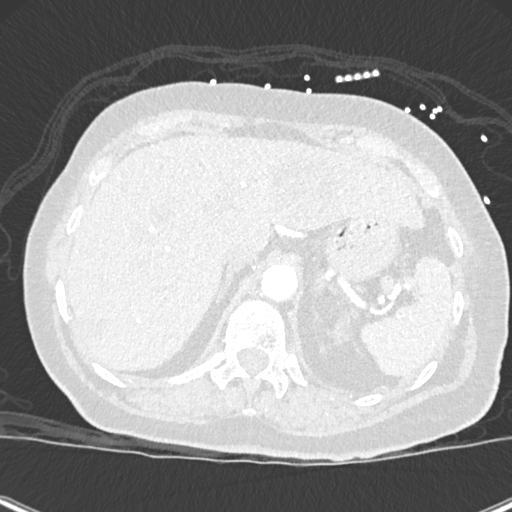
[im 69/293  lung]
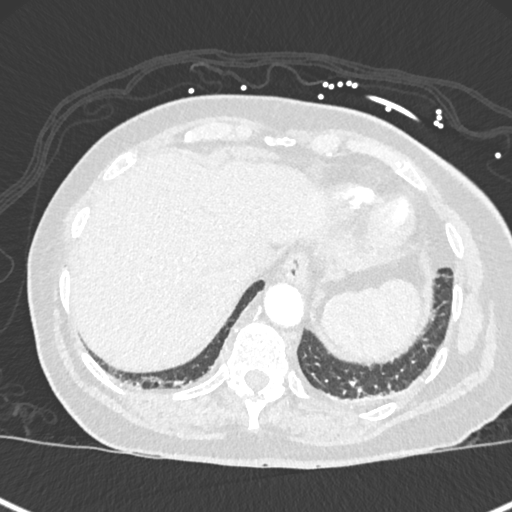
[im 104/293  lung]
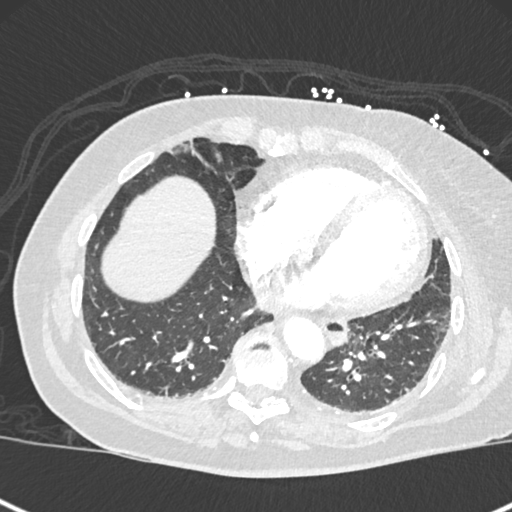
[im 138/293  lung]
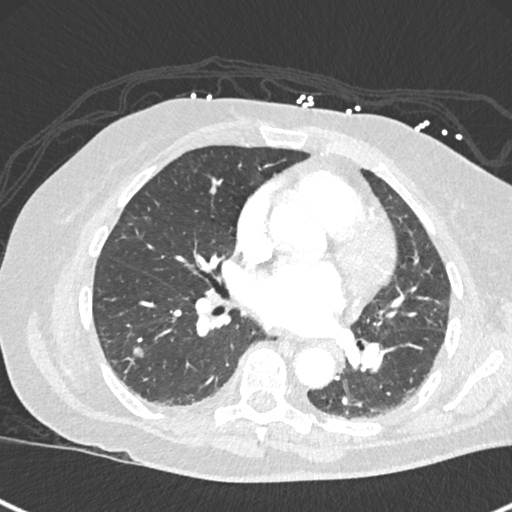
[im 172/293  lung]
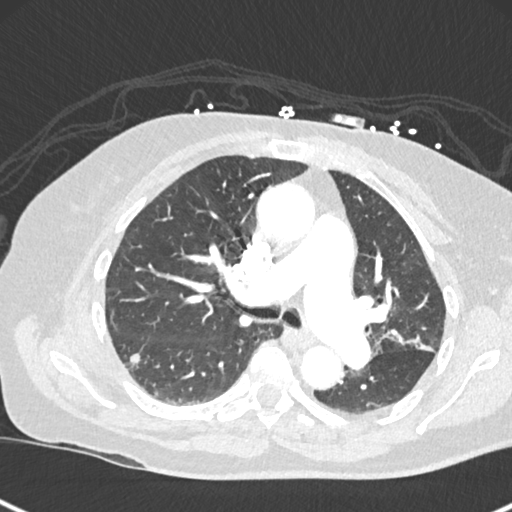
[im 207/293  lung]
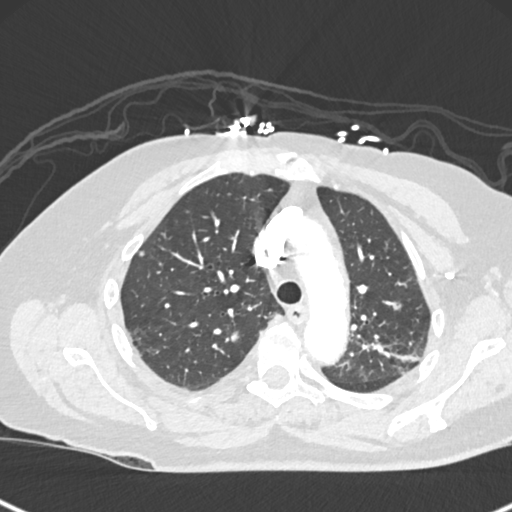
[im 241/293  lung]
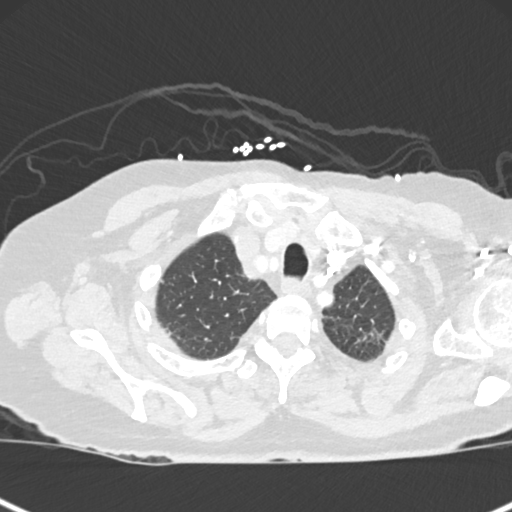
[im 275/293  lung]
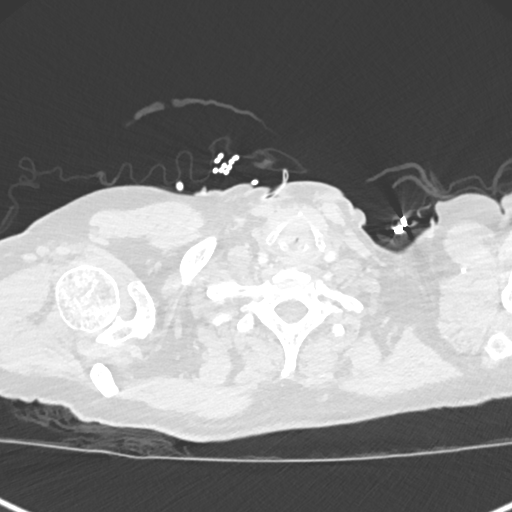

[Series 7: coronal mpr · coronal · 0.69mm/px · 2 of 151 slices shown]
[im 51/151  soft-tissue]
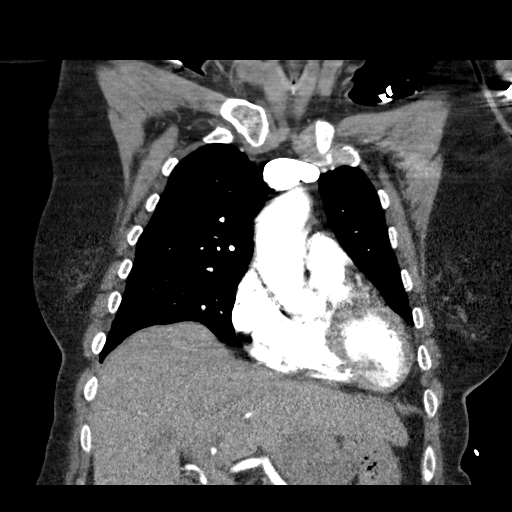
[im 101/151  soft-tissue]
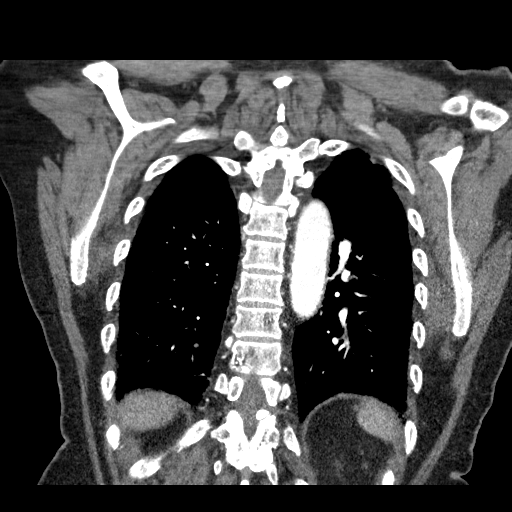

[Series 12: axial st · axial · 0.66mm/px · z∈[-430,-220]mm · 3 of 85 slices shown (2 of 2)]
[im 22/85  lung]
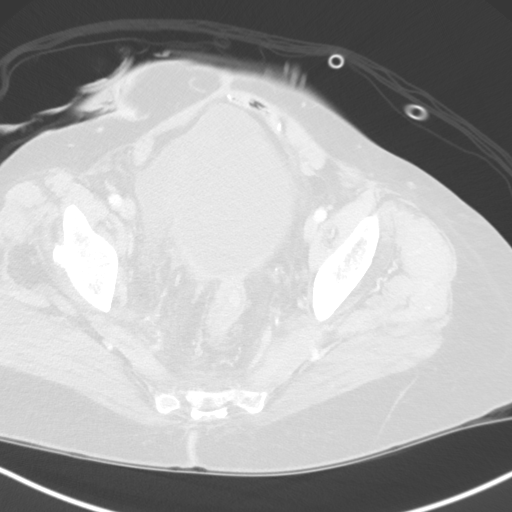
[im 43/85  soft-tissue]
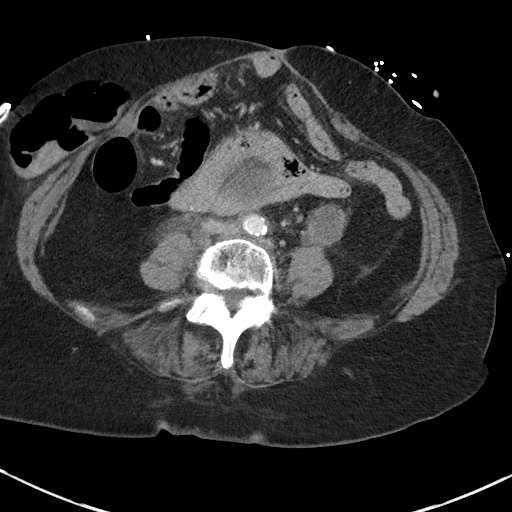
[im 64/85  lung]
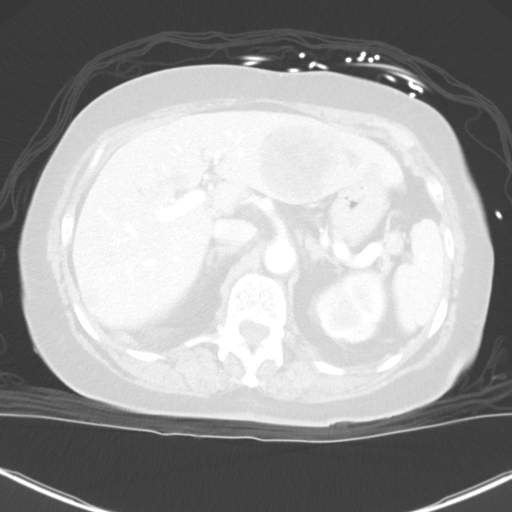

[17 of 46 positions shown; findings below may reference images not displayed]

FINDINGS: CTA CHEST FINDINGS

Cardiovascular: Satisfactory opacification of the pulmonary arteries
to the segmental level. No evidence of pulmonary embolism. Normal
heart size. No pericardial effusion. Atherosclerosis of thoracic
aorta is noted without aneurysm or dissection.

Mediastinum/Nodes: No enlarged mediastinal, hilar, or axillary lymph
nodes. Thyroid gland, trachea, and esophagus demonstrate no
significant findings.

Lungs/Pleura: No pneumothorax or pleural effusion is noted. Multiple
rounded nodules are noted throughout both lungs, some of which are
cavitary, consistent with metastatic disease. The largest measures 1
cm in the right lower lobe.

Musculoskeletal: No chest wall abnormality. No acute or significant
osseous findings.

Review of the MIP images confirms the above findings.

CT ABDOMEN and PELVIS FINDINGS

Hepatobiliary: Multiple rounded masses are noted in the liver
consistent with hepatic metastases. The largest measures 6.7 cm in
the left hepatic lobe.

Pancreas: Unremarkable. No pancreatic ductal dilatation or
surrounding inflammatory changes.

Spleen: Normal in size without focal abnormality.

Adrenals/Urinary Tract: Adrenal glands appear normal. Severe left
hydronephrosis is noted which appears to be due to external
compression of the proximal left ureter by 6.3 x 3.2 cm
retroperitoneal mass consistent with metastatic disease. Severe
right hydronephrosis is noted with dilatation of the proximal right
ureter, due to obstruction of indeterminate etiology. Urinary
bladder is somewhat patulous with herniation of a portion through
the ventral wall of the pelvis.

Stomach/Bowel: The stomach appears normal. Status post colectomy.
Ileostomy is noted in the right lower quadrant with large peristomal
hernia. There is no evidence of bowel obstruction.

Vascular/Lymphatic: Atherosclerosis of thoracic aorta is noted
without aneurysm formation. 14 mm right periaortic lymph node is
noted. Previously described left periaortic mass is noted.

Reproductive: Status post hysterectomy. 13.6 x 13.4 x 10.6 cm cystic
structure is seen arising from the pelvis into the mid abdomen of
unknown etiology.

Other: No abdominal wall hernia or abnormality. No abdominopelvic
ascites.

Musculoskeletal: 2.7 cm lytic lesion is seen in the left ischial
tuberosity concerning for metastatic disease. Multiple other lytic
lesions are noted in the iliac bones bilaterally.

Review of the MIP images confirms the above findings.
IMPRESSION: No definite evidence of pulmonary embolus.

Multiple bilateral pulmonary nodules are noted, some of which are
cystic, concerning for metastatic disease.

Multiple hepatic metastatic lesions are noted, with the largest
measuring 6.7 cm in the left hepatic lobe.

Severe left hydronephrosis is noted which appears to be due to
external compression of the proximal left ureter by 6.3 x 3.2 cm
left retroperitoneal mass or lymph node. Severe right hydronephrosis
is also noted with dilatation of proximal right ureter, due to
obstruction of indeterminate etiology. Distal right ureter is
unremarkable.

13.6 x 13.4 x 10.6 cm cystic structure is seen arising from the
pelvis into the mid abdomen of uncertain etiology. This may be
ovarian etiology, it was not present on prior exam.

Multiple metastatic lesions are noted in the iliac bones
bilaterally.

Herniation of a portion of the urinary bladder through the ventral
wall the pelvis is noted.

Status post colectomy with ileostomy seen in the right lower
quadrant with large peristomal hernia. No definite evidence of bowel
obstruction.

14 mm right periaortic lymph node is noted consistent with
metastatic disease.

Aortic Atherosclerosis ([id number]).
